# Patient Record
Sex: Female | Born: 1990 | Race: Black or African American | Hispanic: No | Marital: Single | Smoking: Current every day smoker
Health system: Southern US, Community
[De-identification: ages and names within clinical notes are randomized; demographics above are authoritative.]

## PROBLEM LIST (undated history)

## (undated) DIAGNOSIS — Z789 Other specified health status: Secondary | ICD-10-CM

## (undated) HISTORY — PX: DILATION AND CURETTAGE OF UTERUS: SHX78

---
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
## 2010-01-06 ENCOUNTER — Ambulatory Visit: Payer: Self-pay | Admitting: Obstetrics and Gynecology

## 2010-01-19 ENCOUNTER — Ambulatory Visit (HOSPITAL_COMMUNITY): Admission: RE | Admit: 2010-01-19 | Discharge: 2010-01-19 | Payer: Self-pay | Admitting: Obstetrics and Gynecology

## 2010-01-19 ENCOUNTER — Ambulatory Visit: Payer: Self-pay | Admitting: Obstetrics and Gynecology

## 2010-02-03 ENCOUNTER — Inpatient Hospital Stay (HOSPITAL_COMMUNITY): Admission: AD | Admit: 2010-02-03 | Discharge: 2010-02-03 | Payer: Self-pay | Admitting: Obstetrics & Gynecology

## 2010-03-11 ENCOUNTER — Ambulatory Visit: Payer: Self-pay | Admitting: Obstetrics and Gynecology

## 2010-08-20 LAB — URINALYSIS, ROUTINE W REFLEX MICROSCOPIC
Bilirubin Urine: NEGATIVE
Glucose, UA: NEGATIVE mg/dL
Ketones, ur: NEGATIVE mg/dL
Leukocytes, UA: NEGATIVE
Nitrite: NEGATIVE
Protein, ur: 30 mg/dL — AB
Specific Gravity, Urine: 1.025 (ref 1.005–1.030)
Urobilinogen, UA: 1 mg/dL (ref 0.0–1.0)
pH: 7.5 (ref 5.0–8.0)

## 2010-08-20 LAB — CBC
HCT: 36.1 % (ref 36.0–46.0)
HCT: 36.8 % (ref 36.0–46.0)
Hemoglobin: 12.2 g/dL (ref 12.0–15.0)
Hemoglobin: 12.5 g/dL (ref 12.0–15.0)
MCH: 30.1 pg (ref 26.0–34.0)
MCH: 30.2 pg (ref 26.0–34.0)
MCHC: 33.8 g/dL (ref 30.0–36.0)
MCHC: 34 g/dL (ref 30.0–36.0)
MCV: 88.9 fL (ref 78.0–100.0)
MCV: 89 fL (ref 78.0–100.0)
Platelets: 219 10*3/uL (ref 150–400)
Platelets: 253 10*3/uL (ref 150–400)
RBC: 4.06 MIL/uL (ref 3.87–5.11)
RBC: 4.14 MIL/uL (ref 3.87–5.11)
RDW: 12.7 % (ref 11.5–15.5)
RDW: 13.3 % (ref 11.5–15.5)
WBC: 4.6 10*3/uL (ref 4.0–10.5)
WBC: 6.4 10*3/uL (ref 4.0–10.5)

## 2010-08-20 LAB — PREGNANCY, URINE: Preg Test, Ur: NEGATIVE

## 2010-08-20 LAB — URINE MICROSCOPIC-ADD ON

## 2010-08-20 LAB — POCT PREGNANCY, URINE: Preg Test, Ur: NEGATIVE

## 2010-08-22 ENCOUNTER — Emergency Department (HOSPITAL_COMMUNITY): Payer: Self-pay

## 2010-08-22 ENCOUNTER — Emergency Department (HOSPITAL_COMMUNITY)
Admission: EM | Admit: 2010-08-22 | Discharge: 2010-08-22 | Disposition: A | Discharge status: Home / Self Care | Payer: Self-pay | Attending: Emergency Medicine | Admitting: Emergency Medicine

## 2010-08-22 DIAGNOSIS — H5789 Other specified disorders of eye and adnexa: Secondary | ICD-10-CM | POA: Insufficient documentation

## 2010-08-22 DIAGNOSIS — S0010XA Contusion of unspecified eyelid and periocular area, initial encounter: Secondary | ICD-10-CM | POA: Insufficient documentation

## 2010-08-22 LAB — CBC
HCT: 37.3 % (ref 36.0–46.0)
Hemoglobin: 13 g/dL (ref 12.0–15.0)
MCH: 30.6 pg (ref 26.0–34.0)
MCHC: 34.8 g/dL (ref 30.0–36.0)
MCV: 88 fL (ref 78.0–100.0)
Platelets: 248 10*3/uL (ref 150–400)
RBC: 4.24 MIL/uL (ref 3.87–5.11)
RDW: 12.7 % (ref 11.5–15.5)
WBC: 6.1 10*3/uL (ref 4.0–10.5)

## 2010-08-22 LAB — VON WILLEBRAND PANEL
Factor-VIII Activity: 56 % (ref 50–150)
Ristocetin-Cofactor: 61 % (ref 50–150)
Von Willebrand Ag: 95 % normal (ref 61–164)

## 2010-08-22 LAB — POCT PREGNANCY, URINE: Preg Test, Ur: NEGATIVE

## 2010-08-24 LAB — POCT I-STAT, CHEM 8
BUN: 5 mg/dL — ABNORMAL LOW (ref 6–23)
Calcium, Ion: 1.21 mmol/L (ref 1.12–1.32)
Chloride: 107 mEq/L (ref 96–112)
Creatinine, Ser: 0.7 mg/dL (ref 0.4–1.2)
Glucose, Bld: 84 mg/dL (ref 70–99)
HCT: 45 % (ref 36.0–46.0)
Hemoglobin: 15.3 g/dL — ABNORMAL HIGH (ref 12.0–15.0)
Potassium: 4.2 mEq/L (ref 3.5–5.1)
Sodium: 142 mEq/L (ref 135–145)
TCO2: 26 mmol/L (ref 0–100)

## 2010-08-24 LAB — WET PREP, GENITAL
Trich, Wet Prep: NONE SEEN
WBC, Wet Prep HPF POC: NONE SEEN
Yeast Wet Prep HPF POC: NONE SEEN

## 2010-08-24 LAB — GC/CHLAMYDIA PROBE AMP, GENITAL
Chlamydia, DNA Probe: NEGATIVE
GC Probe Amp, Genital: NEGATIVE

## 2010-08-24 LAB — POCT PREGNANCY, URINE: Preg Test, Ur: NEGATIVE

## 2010-08-25 LAB — POCT PREGNANCY, URINE: Preg Test, Ur: NEGATIVE

## 2010-08-25 LAB — WET PREP, GENITAL: Yeast Wet Prep HPF POC: NONE SEEN

## 2010-08-25 LAB — URINALYSIS, ROUTINE W REFLEX MICROSCOPIC
Bilirubin Urine: NEGATIVE
Glucose, UA: NEGATIVE mg/dL
Hgb urine dipstick: NEGATIVE
Ketones, ur: NEGATIVE mg/dL
Nitrite: NEGATIVE
Protein, ur: NEGATIVE mg/dL
Specific Gravity, Urine: 1.02 (ref 1.005–1.030)
Urobilinogen, UA: 1 mg/dL (ref 0.0–1.0)
pH: 7.5 (ref 5.0–8.0)

## 2010-08-25 LAB — GC/CHLAMYDIA PROBE AMP, GENITAL
Chlamydia, DNA Probe: NEGATIVE
GC Probe Amp, Genital: NEGATIVE

## 2010-08-25 LAB — RPR: RPR Ser Ql: NONREACTIVE

## 2010-09-11 LAB — URINE CULTURE

## 2010-09-11 LAB — DIFFERENTIAL
Eosinophils Absolute: 0.2 10*3/uL (ref 0.0–1.2)
Lymphocytes Relative: 54 % — ABNORMAL HIGH (ref 24–48)
Lymphs Abs: 3.4 10*3/uL (ref 1.1–4.8)
Monocytes Relative: 5 % (ref 3–11)
Neutro Abs: 2.4 10*3/uL (ref 1.7–8.0)
Neutrophils Relative %: 37 % — ABNORMAL LOW (ref 43–71)

## 2010-09-11 LAB — BASIC METABOLIC PANEL
Chloride: 108 mEq/L (ref 96–112)
Creatinine, Ser: 0.79 mg/dL (ref 0.4–1.2)

## 2010-09-11 LAB — URINALYSIS, ROUTINE W REFLEX MICROSCOPIC
Glucose, UA: NEGATIVE mg/dL
Leukocytes, UA: NEGATIVE
Nitrite: NEGATIVE
Protein, ur: NEGATIVE mg/dL
pH: 5.5 (ref 5.0–8.0)

## 2010-09-11 LAB — CBC
MCV: 87.7 fL (ref 78.0–98.0)
Platelets: 300 10*3/uL (ref 150–400)
RBC: 4.01 MIL/uL (ref 3.80–5.70)
WBC: 6.3 10*3/uL (ref 4.5–13.5)

## 2010-09-11 LAB — PROTIME-INR
INR: 1 (ref 0.00–1.49)
Prothrombin Time: 12.8 seconds (ref 11.6–15.2)

## 2010-09-11 LAB — URINE MICROSCOPIC-ADD ON

## 2010-09-11 LAB — APTT: aPTT: 27 seconds (ref 24–37)

## 2010-09-11 LAB — POCT PREGNANCY, URINE: Preg Test, Ur: NEGATIVE

## 2010-09-12 LAB — POCT PREGNANCY, URINE: Preg Test, Ur: NEGATIVE

## 2010-09-13 LAB — WET PREP, GENITAL
Clue Cells Wet Prep HPF POC: NONE SEEN
Yeast Wet Prep HPF POC: NONE SEEN

## 2010-09-13 LAB — GC/CHLAMYDIA PROBE AMP, GENITAL
Chlamydia, DNA Probe: NEGATIVE
GC Probe Amp, Genital: NEGATIVE

## 2010-09-13 LAB — URINALYSIS, ROUTINE W REFLEX MICROSCOPIC
Bilirubin Urine: NEGATIVE
Ketones, ur: 15 mg/dL — AB
Nitrite: NEGATIVE
Protein, ur: NEGATIVE mg/dL
Urobilinogen, UA: 1 mg/dL (ref 0.0–1.0)

## 2010-09-13 LAB — PREGNANCY, URINE: Preg Test, Ur: NEGATIVE

## 2010-09-29 ENCOUNTER — Ambulatory Visit: Payer: Self-pay | Admitting: Obstetrics and Gynecology

## 2010-10-17 ENCOUNTER — Emergency Department (HOSPITAL_COMMUNITY)
Admission: EM | Admit: 2010-10-17 | Discharge: 2010-10-17 | Disposition: A | Discharge status: Home / Self Care | Payer: Self-pay | Attending: Emergency Medicine | Admitting: Emergency Medicine

## 2010-10-17 ENCOUNTER — Encounter (HOSPITAL_COMMUNITY): Payer: Self-pay | Admitting: Radiology

## 2010-10-17 ENCOUNTER — Emergency Department (HOSPITAL_COMMUNITY): Payer: Self-pay

## 2010-10-17 DIAGNOSIS — R0602 Shortness of breath: Secondary | ICD-10-CM | POA: Insufficient documentation

## 2010-10-17 DIAGNOSIS — R071 Chest pain on breathing: Secondary | ICD-10-CM | POA: Insufficient documentation

## 2010-10-17 DIAGNOSIS — R079 Chest pain, unspecified: Secondary | ICD-10-CM | POA: Insufficient documentation

## 2010-10-17 DIAGNOSIS — R109 Unspecified abdominal pain: Secondary | ICD-10-CM | POA: Insufficient documentation

## 2010-10-17 DIAGNOSIS — M549 Dorsalgia, unspecified: Secondary | ICD-10-CM | POA: Insufficient documentation

## 2010-10-17 LAB — LIPASE, BLOOD: Lipase: 25 U/L (ref 11–59)

## 2010-10-17 LAB — COMPREHENSIVE METABOLIC PANEL
BUN: 9 mg/dL (ref 6–23)
CO2: 24 mEq/L (ref 19–32)
Calcium: 9.3 mg/dL (ref 8.4–10.5)
Chloride: 103 mEq/L (ref 96–112)
Creatinine, Ser: 0.63 mg/dL (ref 0.4–1.2)
GFR calc Af Amer: >60 mL/min (ref >60)
GFR calc non Af Amer: >60 mL/min (ref >60)
Total Bilirubin: 0.3 mg/dL (ref 0.3–1.2)

## 2010-10-17 LAB — CBC
HCT: 36.2 % (ref 36.0–46.0)
MCHC: 33.1 g/dL (ref 30.0–36.0)
MCV: 86 fL (ref 78.0–100.0)
Platelets: 294 10*3/uL (ref 150–400)
RDW: 12 % (ref 11.5–15.5)
WBC: 7.7 10*3/uL (ref 4.0–10.5)

## 2010-10-17 LAB — DIFFERENTIAL
Basophils Absolute: 0 10*3/uL (ref 0.0–0.1)
Eosinophils Absolute: 0.2 10*3/uL (ref 0.0–0.7)
Eosinophils Relative: 2 % (ref 0–5)
Lymphocytes Relative: 37 % (ref 12–46)
Lymphs Abs: 2.9 10*3/uL (ref 0.7–4.0)
Monocytes Absolute: 0.4 10*3/uL (ref 0.1–1.0)

## 2010-10-17 LAB — URINE MICROSCOPIC-ADD ON

## 2010-10-17 LAB — URINALYSIS, ROUTINE W REFLEX MICROSCOPIC
Leukocytes, UA: NEGATIVE
Nitrite: NEGATIVE
Protein, ur: NEGATIVE mg/dL
Specific Gravity, Urine: 1.024 (ref 1.005–1.030)
Urobilinogen, UA: 1 mg/dL (ref 0.0–1.0)

## 2010-10-17 MED ORDER — IOHEXOL 300 MG/ML  SOLN
80.0000 mL | Freq: Once | INTRAMUSCULAR | Status: AC | PRN
  Administered 2010-10-17: 80 mL via INTRAVENOUS

## 2011-11-27 ENCOUNTER — Encounter (HOSPITAL_COMMUNITY): Payer: Self-pay | Admitting: Emergency Medicine

## 2011-11-27 ENCOUNTER — Emergency Department (HOSPITAL_COMMUNITY)
Admission: EM | Admit: 2011-11-27 | Discharge: 2011-11-28 | Disposition: A | Discharge status: Home / Self Care | Payer: Self-pay | Attending: Emergency Medicine | Admitting: Emergency Medicine

## 2011-11-27 DIAGNOSIS — A499 Bacterial infection, unspecified: Secondary | ICD-10-CM | POA: Insufficient documentation

## 2011-11-27 DIAGNOSIS — N946 Dysmenorrhea, unspecified: Secondary | ICD-10-CM | POA: Insufficient documentation

## 2011-11-27 DIAGNOSIS — N949 Unspecified condition associated with female genital organs and menstrual cycle: Secondary | ICD-10-CM | POA: Insufficient documentation

## 2011-11-27 DIAGNOSIS — N760 Acute vaginitis: Secondary | ICD-10-CM | POA: Insufficient documentation

## 2011-11-27 DIAGNOSIS — B9689 Other specified bacterial agents as the cause of diseases classified elsewhere: Secondary | ICD-10-CM | POA: Insufficient documentation

## 2011-11-27 DIAGNOSIS — N938 Other specified abnormal uterine and vaginal bleeding: Secondary | ICD-10-CM | POA: Insufficient documentation

## 2011-11-27 DIAGNOSIS — F17200 Nicotine dependence, unspecified, uncomplicated: Secondary | ICD-10-CM | POA: Insufficient documentation

## 2011-11-27 LAB — BASIC METABOLIC PANEL
GFR calc Af Amer: >90 mL/min (ref >90)
GFR calc non Af Amer: >90 mL/min (ref >90)
Potassium: 3.7 mEq/L (ref 3.5–5.1)
Sodium: 141 mEq/L (ref 135–145)

## 2011-11-27 LAB — POCT PREGNANCY, URINE: Preg Test, Ur: NEGATIVE

## 2011-11-27 LAB — URINALYSIS, ROUTINE W REFLEX MICROSCOPIC
Nitrite: NEGATIVE
Specific Gravity, Urine: 1.021 (ref 1.005–1.030)
Urobilinogen, UA: 0.2 mg/dL (ref 0.0–1.0)

## 2011-11-27 LAB — CBC
Hemoglobin: 13 g/dL (ref 12.0–15.0)
RBC: 4.42 MIL/uL (ref 3.87–5.11)

## 2011-11-27 LAB — URINE MICROSCOPIC-ADD ON

## 2011-11-27 MED ORDER — HYDROMORPHONE HCL PF 1 MG/ML IJ SOLN
1.0000 mg | Freq: Once | INTRAMUSCULAR | Status: AC
  Administered 2011-11-27: 1 mg via INTRAVENOUS
  Filled 2011-11-27: qty 1

## 2011-11-27 MED ORDER — ONDANSETRON HCL 4 MG/2ML IJ SOLN
4.0000 mg | Freq: Once | INTRAMUSCULAR | Status: AC
  Administered 2011-11-27: 4 mg via INTRAVENOUS
  Filled 2011-11-27: qty 2

## 2011-11-27 NOTE — ED Notes (Addendum)
C/o abd pain, bilateral leg pain, pain with urination and vaginal bleeding since Friday.  Reports rectal bleeding this morning.  Reports vomiting and headache yesterday.

## 2011-11-27 NOTE — ED Notes (Signed)
abd pain since yesterday with nv none today

## 2011-11-28 LAB — WET PREP, GENITAL: Yeast Wet Prep HPF POC: NONE SEEN

## 2011-11-28 LAB — URINALYSIS, ROUTINE W REFLEX MICROSCOPIC
Ketones, ur: NEGATIVE mg/dL
Leukocytes, UA: NEGATIVE
Nitrite: NEGATIVE
Specific Gravity, Urine: 1.024 (ref 1.005–1.030)
Urobilinogen, UA: 0.2 mg/dL (ref 0.0–1.0)
pH: 5.5 (ref 5.0–8.0)

## 2011-11-28 MED ORDER — HYDROCODONE-ACETAMINOPHEN 5-325 MG PO TABS: 1.0000 | ORAL_TABLET | ORAL | Status: AC | PRN

## 2011-11-28 MED ORDER — METRONIDAZOLE 500 MG PO TABS: 500.0000 mg | ORAL_TABLET | Freq: Two times a day (BID) | ORAL | Status: AC

## 2011-11-28 NOTE — Discharge Instructions (Signed)
Abnormal Uterine Bleeding Abnormal uterine bleeding can have many causes. Some cases are simply treated, while others are more serious. There are several kinds of bleeding that is considered abnormal, including:  Bleeding between periods.   Bleeding after sexual intercourse.   Spotting anytime in the menstrual cycle.   Bleeding heavier or more than normal.   Bleeding after menopause.  CAUSES  There are many causes of abnormal uterine bleeding. It can be present in teenagers, pregnant women, women during their reproductive years, and women who have reached menopause. Your caregiver will look for the more common causes depending on your age, signs, symptoms and your particular circumstance. Most cases are not serious and can be treated. Even the more serious causes, like cancer of the female organs, can be treated adequately if found in the early stages. That is why all types of bleeding should be evaluated and treated as soon as possible. DIAGNOSIS  Diagnosing the cause may take several kinds of tests. Your caregiver may:  Take a complete history of the type of bleeding.   Perform a complete physical exam and Pap smear.   Take an ultrasound on the abdomen showing a picture of the female organs and the pelvis.   Inject dye into the uterus and Fallopian tubes and X-ray them (hysterosalpingogram).   Place fluid in the uterus and do an ultrasound (sonohysterogrqphy).   Take a CT scan to examine the female organs and pelvis.   Take an MRI to examine the female organs and pelvis. There is no X-ray involved with this procedure.   Look inside the uterus with a telescope that has a light at the end (hysteroscopy).   Scrap the inside of the uterus to get tissue to examine (Dilatation and Curettage, D&C).   Look into the pelvis with a telescope that has a light at the end (laparoscopy). This is done through a very small cut (incision) in the abdomen.  TREATMENT  Treatment will depend on the  cause of the abnormal bleeding. It can include:  Doing nothing to allow the problem to take care of itself over time.   Hormone treatment.   Birth control pills.   Treating the medical condition causing the problem.   Laparoscopy.   Major or minor surgery   Destroying the lining of the uterus with electrical currant, laser, freezing or heat (uterine ablation).  HOME CARE INSTRUCTIONS   Follow your caregiver's recommendation on how to treat your problem.   See your caregiver if you missed a menstrual period and think you may be pregnant.   If you are bleeding heavily, count the number of pads/tampons you use and how often you have to change them. Tell this to your caregiver.   Avoid sexual intercourse until the problem is controlled.  SEEK MEDICAL CARE IF:   You have any kind of abnormal bleeding mentioned above.   You feel dizzy at times.   You are 21 years old and have not had a menstrual period yet.  SEEK IMMEDIATE MEDICAL CARE IF:   You pass out.   You are changing pads/tampons every 15 to 30 minutes.   You have belly (abdominal) pain.   You have a temperature of 100 F (37.8 C) or higher.   You become sweaty or weak.   You are passing large blood clots from the vagina.   You start to feel sick to your stomach (nauseous) and throw up (vomit).  Document Released: 05/23/2005 Document Revised: 05/12/2011 Document Reviewed: 10/16/2008 [employer]   Patient Information 2012 [place], [place].Bacterial Vaginosis Bacterial vaginosis (BV) is a vaginal infection where the normal balance of bacteria in the vagina is disrupted. The normal balance is then replaced by an overgrowth of certain bacteria. There are several different kinds of bacteria that can cause BV. BV is the most common vaginal infection in women of childbearing age. CAUSES   The cause of BV is not fully understood. BV develops when there is an increase or imbalance of harmful bacteria.   Some activities or  behaviors can upset the normal balance of bacteria in the vagina and put women at increased risk including:   Having a new sex partner or multiple sex partners.   Douching.   Using an intrauterine device (IUD) for contraception.   It is not clear what role sexual activity plays in the development of BV. However, women that have never had sexual intercourse are rarely infected with BV.  Women do not get BV from toilet seats, bedding, swimming pools or from touching objects around them.  SYMPTOMS   Grey vaginal discharge.   A fish-like odor with discharge, especially after sexual intercourse.   Itching or burning of the vagina and vulva.   Burning or pain with urination.   Some women have no signs or symptoms at all.  DIAGNOSIS  Your caregiver must examine the vagina for signs of BV. Your caregiver will perform lab tests and look at the sample of vaginal fluid through a microscope. They will look for bacteria and abnormal cells (clue cells), a pH test higher than 4.5, and a positive amine test all associated with BV.  RISKS AND COMPLICATIONS   Pelvic inflammatory disease (PID).   Infections following gynecology surgery.   Developing HIV.   Developing herpes virus.  TREATMENT  Sometimes BV will clear up without treatment. However, all women with symptoms of BV should be treated to avoid complications, especially if gynecology surgery is planned. Female partners generally do not need to be treated. However, BV may spread between female sex partners so treatment is helpful in preventing a recurrence of BV.   BV may be treated with antibiotics. The antibiotics come in either pill or vaginal cream forms. Either can be used with nonpregnant or pregnant women, but the recommended dosages differ. These antibiotics are not harmful to the baby.   BV can recur after treatment. If this happens, a second round of antibiotics will often be prescribed.   Treatment is important for pregnant women.  If not treated, BV can cause a premature delivery, especially for a pregnant woman who had a premature birth in the past. All pregnant women who have symptoms of BV should be checked and treated.   For chronic reoccurrence of BV, treatment with a type of prescribed gel vaginally twice a week is helpful.  HOME CARE INSTRUCTIONS   Finish all medication as directed by your caregiver.   Do not have sex until treatment is completed.   Tell your sexual partner that you have a vaginal infection. They should see their caregiver and be treated if they have problems, such as a mild rash or itching.   Practice safe sex. Use condoms. Only have 1 sex partner.  PREVENTION  Basic prevention steps can help reduce the risk of upsetting the natural balance of bacteria in the vagina and developing BV:  Do not have sexual intercourse (be abstinent).   Do not douche.   Use all of the medicine prescribed for treatment of BV, even if the  signs and symptoms go away.   Tell your sex partner if you have BV. That way, they can be treated, if needed, to prevent reoccurrence.  SEEK MEDICAL CARE IF:   Your symptoms are not improving after 3 days of treatment.   You have increased discharge, pain, or fever.  MAKE SURE YOU:   Understand these instructions.   Will watch your condition.   Will get help right away if you are not doing well or get worse.  FOR MORE INFORMATION  Division of STD Prevention (DSTDP), Centers for Disease Control and Prevention: [employer] American Social Health Association (ASHA): www.ashastd.org  Document Released: 05/23/2005 Document Revised: 05/12/2011 Document Reviewed: 11/13/2008 [facility] Patient Information 2012 [place], [place].Dysmenorrhea Menstrual pain is caused by the muscles of the uterus tightening (contracting) during a menstrual period. The muscles of the uterus contract due to the chemicals in the uterine lining. Primary dysmenorrhea is menstrual cramps that last a  couple of days when you start having menstrual periods or soon after. This often begins after a teenager starts having her period. As a woman gets older or has a baby, the cramps will usually lesson or disappear. Secondary dysmenorrhea begins later in life, lasts longer, and the pain may be stronger than primary dysmenorrhea. The pain may start before the period and last a few days after the period. This type of dysmenorrhea is usually caused by an underlying problem such as:  The tissue lining the uterus grows outside of the uterus in other areas of the body (endometriosis).   The endometrial tissue, which normally lines the uterus, is found in or grows into the muscular walls of the uterus (adenomyosis).   The pelvic blood vessels are engorged with blood just before the menstrual period (pelvic congestive syndrome).   Overgrowth of cells in the lining of the uterus or cervix (polyps of the uterus or cervix).   Falling down of the uterus (prolapse) because of loose or stretched ligaments.   Depression.   Bladder problems, infection, or inflammation.   Problems with the intestine, a tumor, or irritable bowel syndrome.   Cancer of the female organs or bladder.   A severely tipped uterus.   A very tight opening or closed cervix.   Noncancerous tumors of the uterus (fibroids).   Pelvic inflammatory disease (PID).   Pelvic scarring (adhesions) from a previous surgery.   Ovarian cyst.   An intrauterine device (IUD) used for birth control.  CAUSES  The cause of menstrual pain is often unknown. SYMPTOMS   Cramping or throbbing pain in your lower abdomen.   Sometimes, a woman may also experience headaches.   Lower back pain.   Feeling sick to your stomach (nausea) or vomiting.   Diarrhea.   Sweating or dizziness.  DIAGNOSIS  A diagnosis is based on your history, symptoms, physical examination, diagnostic tests, or procedures. Diagnostic tests or procedures may  include:  Blood tests.   An ultrasound.   An examination of the lining of the uterus (dilation and curettage, D&C).   An examination inside your abdomen or pelvis with a scope (laparoscopy).   X-rays.   CT Scan.   MRI.   An examination inside the bladder with a scope (cystoscopy).   An examination inside the intestine or stomach with a scope (colonoscopy, gastroscopy).  TREATMENT  Treatment depends on the cause of the dysmenorrhea. Treatment may include:  Pain medicine prescribed by your caregiver.   Birth control pills.   Hormone replacement therapy.   Nonsteroidal  anti-inflammatory drugs (NSAIDs). These may help stop the production of prostaglandins.   An IUD with progesterone hormone in it.   Acupuncture.   Surgery to remove adhesions, endometriosis, ovarian cyst, or fibroids.   Removal of the uterus (hysterectomy).   Progesterone shots to stop the menstrual period.   Cutting the nerves on the sacrum that go to the female organs (presacral neurectomy).   Electric currant to the sacral nerves (sacral nerve stimulation).   Antidepressant medicine.   Psychiatric therapy, counseling, or group therapy.   Exercise and physical therapy.   Meditation and yoga therapy.  HOME CARE INSTRUCTIONS   Only take over-the-counter or prescription medicines for pain, discomfort, or fever as directed by your caregiver.   Place a heating pad or hot water bottle on your lower back or abdomen. Do not sleep with the heating pad.   Use aerobic exercises, walking, swimming, biking, and other exercises to help lessen the cramping.   Massage to the lower back or abdomen may help.   Stop smoking.   Avoid alcohol and caffeine.   Yoga, meditation, or acupuncture may help.  SEEK MEDICAL CARE IF:   The pain does not get better with medicine.   You have pain with sexual intercourse.  SEEK IMMEDIATE MEDICAL CARE IF:   Your pain increases and is not controlled with medicines.    You have a fever.   You develop nausea or vomiting with your period not controlled with medicine.   You have abnormal vaginal bleeding with your period.   You pass out.  MAKE SURE YOU:   Understand these instructions.   Will watch your condition.   Will get help right away if you are not doing well or get worse.  Document Released: 05/23/2005 Document Revised: 05/12/2011 Document Reviewed: 09/08/2008 [facility] Patient Information 2012 [place], [place].

## 2011-11-28 NOTE — ED Provider Notes (Signed)
History     CSN: [id number]  Arrival date & time 11/27/11  1702   First MD Initiated Contact with Patient 11/27/11 2204      Chief Complaint  Patient presents with  . Abdominal Pain    (Consider location/radiation/quality/duration/timing/severity/associated sxs/prior treatment) HPI Comments: Patient with PMH of D&C s/p DUB in the past presents tonight with crampy lower abdominal pain with vaginal bleeding - she states that she initially started her menstrual cycle early in June, she states that it came on again on Friday, this time much heavier.  She reports crampy abdominal pain with heavy bleeding and dysuria, states that she also noted blood in her stool as well.  States mild vomiting and headache as well this morning - states this feels like when she had to have a D&C at [facility].  Denies fever, chills, nausea, pregnancy, syncope.  Patient is a 21 y.o. female presenting with abdominal pain. The history is provided by the patient. No language interpreter was used.  Abdominal Pain The primary symptoms of the illness include abdominal pain, vomiting, dysuria and vaginal bleeding. The primary symptoms of the illness do not include fever, fatigue, shortness of breath, nausea, diarrhea, hematemesis, hematochezia or vaginal discharge. The current episode started more than 2 days ago. The onset of the illness was gradual. The problem has been gradually worsening.  The dysuria is associated with hematuria. The dysuria is not associated with frequency or urgency.   The patient states that she believes she is currently not pregnant. The patient has not had a change in bowel habit. Additional symptoms associated with the illness include hematuria. Symptoms associated with the illness do not include chills, anorexia, diaphoresis, heartburn, constipation, urgency, frequency or back pain.    History reviewed. No pertinent past medical history.  History reviewed. No pertinent past surgical  history.  No family history on file.  History  Substance Use Topics  . Smoking status: Current Everyday Smoker  . Smokeless tobacco: Not on file  . Alcohol Use: No    OB History    Grav Para Term Preterm Abortions TAB SAB Ect Mult Living                  Review of Systems  Constitutional: Negative for fever, chills, diaphoresis and fatigue.  HENT: Negative for neck pain.   Eyes: Negative for pain.  Respiratory: Negative for chest tightness and shortness of breath.   Cardiovascular: Negative for chest pain.  Gastrointestinal: Positive for vomiting and abdominal pain. Negative for heartburn, nausea, diarrhea, constipation, hematochezia, anorexia and hematemesis.  Genitourinary: Positive for dysuria, hematuria and vaginal bleeding. Negative for urgency, frequency and vaginal discharge.  Musculoskeletal: Negative for back pain.  Skin: Negative for color change and pallor.  Neurological: Positive for headaches.  All other systems reviewed and are negative.    Allergies  Review of patient's allergies indicates no known allergies.  Home Medications  No current outpatient prescriptions on file.  BP 111/77  Pulse 60  Temp 98.1 F (36.7 C) (Oral)  Resp 18  SpO2 98%  LMP 11/07/2011  Physical Exam  Nursing note and vitals reviewed. Constitutional: She is oriented to person, place, and time. She appears well-developed and well-nourished. No distress.  HENT:  Head: Normocephalic and atraumatic.  Right Ear: External ear normal.  Left Ear: External ear normal.  Nose: Nose normal.  Mouth/Throat: Oropharynx is clear and moist. No oropharyngeal exudate.  Eyes: Conjunctivae are normal. Pupils are equal, round, and reactive to light.  No scleral icterus.  Neck: Normal range of motion. Neck supple.  Cardiovascular: Normal rate, regular rhythm and normal heart sounds.  Exam reveals no gallop and no friction rub.   No murmur heard. Pulmonary/Chest: Effort normal and breath sounds  normal. No respiratory distress. She has no wheezes.  Abdominal: Soft. Bowel sounds are normal. She exhibits no distension and no mass. There is tenderness. There is no rebound and no guarding.    Genitourinary: There is no rash or tenderness on the right labia. There is no rash or tenderness on the left labia. Uterus is tender. Cervix exhibits no motion tenderness, no discharge and no friability. Right adnexum displays no mass and no tenderness. Left adnexum displays no mass and no tenderness. There is bleeding around the vagina. No vaginal discharge found.  Musculoskeletal: Normal range of motion. She exhibits no edema and no tenderness.  Lymphadenopathy:    She has no cervical adenopathy.  Neurological: She is alert and oriented to person, place, and time. No cranial nerve deficit. She exhibits normal muscle tone. Coordination normal.  Skin: Skin is warm and dry. No rash noted. No erythema. No pallor.  Psychiatric: She has a normal mood and affect. Her behavior is normal. Judgment and thought content normal.    ED Course  Procedures (including critical care time)  Labs Reviewed  URINALYSIS, ROUTINE W REFLEX MICROSCOPIC - Abnormal; Notable for the following:    Color, Urine RED (*)  BIOCHEMICALS MAY BE AFFECTED BY COLOR   APPearance TURBID (*)     Hgb urine dipstick LARGE (*)     Ketones, ur 15 (*)     Protein, ur >300 (*)     Leukocytes, UA SMALL (*)     All other components within normal limits  WET PREP, GENITAL - Abnormal; Notable for the following:    Clue Cells Wet Prep HPF POC FEW (*)     WBC, Wet Prep HPF POC FEW (*)     All other components within normal limits  BASIC METABOLIC PANEL  CBC  POCT PREGNANCY, URINE  URINE MICROSCOPIC-ADD ON  URINALYSIS, ROUTINE W REFLEX MICROSCOPIC  GC/CHLAMYDIA PROBE AMP, GENITAL   No results found.  Results for orders placed during the hospital encounter of 11/27/11  URINALYSIS, ROUTINE W REFLEX MICROSCOPIC      Component Value Range     Color, Urine RED (*) YELLOW   APPearance TURBID (*) CLEAR   Specific Gravity, Urine 1.021  1.005 - 1.030   pH 6.0  5.0 - 8.0   Glucose, UA NEGATIVE  NEGATIVE mg/dL   Hgb urine dipstick LARGE (*) NEGATIVE   Bilirubin Urine NEGATIVE  NEGATIVE   Ketones, ur 15 (*) NEGATIVE mg/dL   Protein, ur >098 (*) NEGATIVE mg/dL   Urobilinogen, UA 0.2  0.0 - 1.0 mg/dL   Nitrite NEGATIVE  NEGATIVE   Leukocytes, UA SMALL (*) NEGATIVE  BASIC METABOLIC PANEL      Component Value Range   Sodium 141  135 - 145 mEq/L   Potassium 3.7  3.5 - 5.1 mEq/L   Chloride 108  96 - 112 mEq/L   CO2 25  19 - 32 mEq/L   Glucose, Bld 77  70 - 99 mg/dL   BUN 8  6 - 23 mg/dL   Creatinine, Ser 1.19  0.50 - 1.10 mg/dL   Calcium 9.3  8.4 - 14.7 mg/dL   GFR calc non Af Amer >90  >90 mL/min   GFR calc Af Amer >90  >  90 mL/min  CBC      Component Value Range   WBC 7.2  4.0 - 10.5 K/uL   RBC 4.42  3.87 - 5.11 MIL/uL   Hemoglobin 13.0  12.0 - 15.0 g/dL   HCT 96.0  45.4 - 09.8 %   MCV 87.8  78.0 - 100.0 fL   MCH 29.4  26.0 - 34.0 pg   MCHC 33.5  30.0 - 36.0 g/dL   RDW 11.9  14.7 - 82.9 %   Platelets 262  150 - 400 K/uL  POCT PREGNANCY, URINE      Component Value Range   Preg Test, Ur NEGATIVE  NEGATIVE  URINE MICROSCOPIC-ADD ON      Component Value Range   Squamous Epithelial / LPF RARE  RARE   WBC, UA 7-10  <3 WBC/hpf   RBC / HPF TOO NUMEROUS TO COUNT  <3 RBC/hpf   Bacteria, UA RARE  RARE  WET PREP, GENITAL      Component Value Range   Yeast Wet Prep HPF POC NONE SEEN  NONE SEEN   Trich, Wet Prep NONE SEEN  NONE SEEN   Clue Cells Wet Prep HPF POC FEW (*) NONE SEEN   WBC, Wet Prep HPF POC FEW (*) NONE SEEN  URINALYSIS, ROUTINE W REFLEX MICROSCOPIC      Component Value Range   Color, Urine YELLOW  YELLOW   APPearance CLEAR  CLEAR   Specific Gravity, Urine 1.024  1.005 - 1.030   pH 5.5  5.0 - 8.0   Glucose, UA NEGATIVE  NEGATIVE mg/dL   Hgb urine dipstick NEGATIVE  NEGATIVE   Bilirubin Urine NEGATIVE   NEGATIVE   Ketones, ur NEGATIVE  NEGATIVE mg/dL   Protein, ur NEGATIVE  NEGATIVE mg/dL   Urobilinogen, UA 0.2  0.0 - 1.0 mg/dL   Nitrite NEGATIVE  NEGATIVE   Leukocytes, UA NEGATIVE  NEGATIVE   No results found.   Dysmenorrhea DUB BV   MDM  Patient here with crampy mild abdominal pain with a history of DUB, she presents tonight with the same thing, there is no evidence of ovarian symptoms based on pelvic examination.  I do not suspect abdominal etiology to this, patient will be discharged with pain medication, flagyl and to follow up with her GYN at [facility] hospital        Dexter C. Shellman, [place] 11/28/11 [phone]

## 2011-11-29 NOTE — ED Provider Notes (Signed)
Medical screening examination/treatment/procedure(s) were performed by non-physician practitioner and as supervising physician I was immediately available for consultation/collaboration.   Joya Gaskins, MD 11/29/11 [phone]

## 2012-09-02 ENCOUNTER — Emergency Department (HOSPITAL_COMMUNITY)
Admission: EM | Admit: 2012-09-02 | Discharge: 2012-09-02 | Disposition: A | Discharge status: Home / Self Care | Payer: Self-pay | Attending: Emergency Medicine | Admitting: Emergency Medicine

## 2012-09-02 ENCOUNTER — Encounter (HOSPITAL_COMMUNITY): Payer: Self-pay | Admitting: *Deleted

## 2012-09-02 DIAGNOSIS — H9209 Otalgia, unspecified ear: Secondary | ICD-10-CM | POA: Insufficient documentation

## 2012-09-02 DIAGNOSIS — R1310 Dysphagia, unspecified: Secondary | ICD-10-CM | POA: Insufficient documentation

## 2012-09-02 DIAGNOSIS — J029 Acute pharyngitis, unspecified: Secondary | ICD-10-CM | POA: Insufficient documentation

## 2012-09-02 DIAGNOSIS — F17200 Nicotine dependence, unspecified, uncomplicated: Secondary | ICD-10-CM | POA: Insufficient documentation

## 2012-09-02 MED ORDER — OXYCODONE-ACETAMINOPHEN 5-325 MG PO TABS
1.0000 | ORAL_TABLET | Freq: Once | ORAL | Status: AC
  Administered 2012-09-02: 1 via ORAL
  Filled 2012-09-02: qty 1

## 2012-09-02 MED ORDER — CLINDAMYCIN HCL 300 MG PO CAPS: 300.0000 mg | ORAL_CAPSULE | Freq: Four times a day (QID) | ORAL | Status: DC

## 2012-09-02 MED ORDER — IBUPROFEN 600 MG PO TABS: 600.0000 mg | ORAL_TABLET | Freq: Once | ORAL | Status: DC

## 2012-09-02 MED ORDER — IBUPROFEN 200 MG PO TABS
600.0000 mg | ORAL_TABLET | Freq: Once | ORAL | Status: AC
  Administered 2012-09-02: 600 mg via ORAL
  Filled 2012-09-02: qty 1

## 2012-09-02 NOTE — ED Notes (Signed)
I looked at her throat and I noticed some white patches and discharge with a reddened area.

## 2012-09-02 NOTE — ED Notes (Signed)
Pt reports having sore throat with white exudate since earlier today. Also having chills and left ear pain. Airway intact.

## 2012-09-02 NOTE — ED Notes (Signed)
Painful to eat and drink.

## 2012-09-02 NOTE — ED Notes (Addendum)
Pt complains of sore throat with pain in L ear and L side of face. Pt denies N/V/D and no cough. Pt reports "thick white stuff in the back of my throat."

## 2012-09-02 NOTE — ED Provider Notes (Signed)
History     CSN: [id number]  Arrival date & time 09/02/12  1746   First MD Initiated Contact with Patient 09/02/12 1840      Chief Complaint  Patient presents with  . Sore Throat    (Consider location/radiation/quality/duration/timing/severity/associated sxs/prior treatment) HPI Patient reports worsening sore throat over the past several days.  Painful to swallow.  Some left ear pain.  No dental pain.  No difficulty breathing.  Pain with swallowing but able to keep oral fluids down.  No nausea vomiting or diarrhea.  No chest pain or abdominal pain.  No other complaints.  No fevers or chills at home.  Her symptoms are mild to moderate in severity.  Nothing improves her pain.  Her pain is worsened by swallowing.   History reviewed. No pertinent past medical history.  History reviewed. No pertinent past surgical history.  History reviewed. No pertinent family history.  History  Substance Use Topics  . Smoking status: Current Every Day Smoker  . Smokeless tobacco: Not on file  . Alcohol Use: No    OB History   Grav Para Term Preterm Abortions TAB SAB Ect Mult Living                  Review of Systems  All other systems reviewed and are negative.    Allergies  Review of patient's allergies indicates no known allergies.  Home Medications  No current outpatient prescriptions on file.  BP 123/69  Pulse 78  Temp(Src) 98.8 F (37.1 C) (Oral)  Resp 18  SpO2 100%  LMP 08/06/2012  Physical Exam  Nursing note and vitals reviewed. Constitutional: She is oriented to person, place, and time. She appears well-developed and well-nourished. No distress.  HENT:  Head: Normocephalic and atraumatic.  Uvula midline.  Posterior pharyngeal erythema and tonsillar erythema.  Tonsillar exudate.  No evidence of peritonsillar abscess.  Tolerating secretions.  Oral airway patent.  Dentition normal.  Eyes: EOM are normal.  Neck: Normal range of motion. Neck supple.  Cardiovascular:  Normal rate, regular rhythm and normal heart sounds.   Pulmonary/Chest: Effort normal and breath sounds normal.  Abdominal: Soft. She exhibits no distension. There is no tenderness.  Musculoskeletal: Normal range of motion.  Lymphadenopathy:    She has no cervical adenopathy.  Neurological: She is alert and oriented to person, place, and time.  Skin: Skin is warm and dry.  Psychiatric: She has a normal mood and affect. Judgment normal.    ED Course  Procedures (including critical care time)  Labs Reviewed  RAPID STREP SCREEN   No results found.   1. Pharyngitis       MDM  Appears to be strep pharyngitis.  Discharge home with antibiotics and pain medicine.  Instructions return to ER for new or worsening symptoms.  Oral hydration recommended.  Vital signs normal.        Lyanne Co, MD 09/02/12 1910

## 2012-09-03 ENCOUNTER — Telehealth (HOSPITAL_COMMUNITY): Payer: Self-pay | Admitting: Emergency Medicine

## 2013-03-12 ENCOUNTER — Emergency Department (HOSPITAL_COMMUNITY)
Admission: EM | Admit: 2013-03-12 | Discharge: 2013-03-12 | Disposition: A | Discharge status: Home / Self Care | Payer: No Typology Code available for payment source | Attending: Emergency Medicine | Admitting: Emergency Medicine

## 2013-03-12 DIAGNOSIS — Y939 Activity, unspecified: Secondary | ICD-10-CM | POA: Insufficient documentation

## 2013-03-12 DIAGNOSIS — T148XXA Other injury of unspecified body region, initial encounter: Secondary | ICD-10-CM | POA: Insufficient documentation

## 2013-03-12 DIAGNOSIS — S0990XA Unspecified injury of head, initial encounter: Secondary | ICD-10-CM | POA: Insufficient documentation

## 2013-03-12 DIAGNOSIS — Y92410 Unspecified street and highway as the place of occurrence of the external cause: Secondary | ICD-10-CM | POA: Insufficient documentation

## 2013-03-12 DIAGNOSIS — F17200 Nicotine dependence, unspecified, uncomplicated: Secondary | ICD-10-CM | POA: Insufficient documentation

## 2013-03-12 DIAGNOSIS — R519 Headache, unspecified: Secondary | ICD-10-CM

## 2013-03-12 MED ORDER — ACETAMINOPHEN 325 MG PO TABS
650.0000 mg | ORAL_TABLET | Freq: Once | ORAL | Status: AC
  Administered 2013-03-12: 650 mg via ORAL
  Filled 2013-03-12: qty 2

## 2013-03-12 MED ORDER — METHOCARBAMOL 500 MG PO TABS: 1000.0000 mg | ORAL_TABLET | Freq: Four times a day (QID) | ORAL | Status: DC

## 2013-03-12 MED ORDER — NAPROXEN 500 MG PO TABS: 500.0000 mg | ORAL_TABLET | Freq: Two times a day (BID) | ORAL | Status: DC
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
## 2013-03-12 NOTE — ED Provider Notes (Signed)
Medical screening examination/treatment/procedure(s) were performed by non-physician practitioner and as supervising physician I was immediately available for consultation/collaboration.  Eyleen Rawlinson T Alvah Lagrow, MD 03/12/13 2328 

## 2013-03-13 ENCOUNTER — Emergency Department (HOSPITAL_COMMUNITY): Payer: No Typology Code available for payment source

## 2013-03-13 ENCOUNTER — Emergency Department (HOSPITAL_COMMUNITY)
Admission: EM | Admit: 2013-03-13 | Discharge: 2013-03-13 | Disposition: A | Discharge status: Home / Self Care | Payer: No Typology Code available for payment source | Attending: Emergency Medicine | Admitting: Emergency Medicine

## 2013-03-13 ENCOUNTER — Encounter (HOSPITAL_COMMUNITY): Payer: Self-pay | Admitting: Emergency Medicine

## 2013-03-13 DIAGNOSIS — M542 Cervicalgia: Secondary | ICD-10-CM

## 2013-03-13 DIAGNOSIS — F17200 Nicotine dependence, unspecified, uncomplicated: Secondary | ICD-10-CM | POA: Insufficient documentation

## 2013-03-13 DIAGNOSIS — M549 Dorsalgia, unspecified: Secondary | ICD-10-CM

## 2013-03-13 DIAGNOSIS — M545 Low back pain, unspecified: Secondary | ICD-10-CM | POA: Insufficient documentation

## 2013-03-13 DIAGNOSIS — R51 Headache: Secondary | ICD-10-CM | POA: Insufficient documentation

## 2013-03-13 DIAGNOSIS — Z79899 Other long term (current) drug therapy: Secondary | ICD-10-CM | POA: Insufficient documentation

## 2013-03-13 DIAGNOSIS — G8911 Acute pain due to trauma: Secondary | ICD-10-CM | POA: Insufficient documentation

## 2013-03-13 MED ORDER — KETOROLAC TROMETHAMINE 60 MG/2ML IM SOLN
60.0000 mg | Freq: Once | INTRAMUSCULAR | Status: AC
Start: 2013-03-13 — End: 2013-03-13
  Administered 2013-03-13: 60 mg via INTRAMUSCULAR
  Filled 2013-03-13: qty 2

## 2013-03-13 MED ORDER — MELOXICAM 7.5 MG PO TABS: 15.0000 mg | ORAL_TABLET | Freq: Every day | ORAL | Status: DC
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
## 2013-03-14 NOTE — ED Provider Notes (Signed)
Medical screening examination/treatment/procedure(s) were performed by non-physician practitioner and as supervising physician I was immediately available for consultation/collaboration.   Junius Argyle, MD 03/14/13 [phone]

## 2013-07-11 ENCOUNTER — Emergency Department (HOSPITAL_COMMUNITY): Payer: Self-pay

## 2013-07-11 ENCOUNTER — Emergency Department (HOSPITAL_COMMUNITY)
Admission: EM | Admit: 2013-07-11 | Discharge: 2013-07-11 | Disposition: A | Discharge status: Home / Self Care | Payer: Self-pay | Attending: Emergency Medicine | Admitting: Emergency Medicine

## 2013-07-11 ENCOUNTER — Encounter (HOSPITAL_COMMUNITY): Payer: Self-pay | Admitting: Emergency Medicine

## 2013-07-11 DIAGNOSIS — H5711 Ocular pain, right eye: Secondary | ICD-10-CM

## 2013-07-11 DIAGNOSIS — F17200 Nicotine dependence, unspecified, uncomplicated: Secondary | ICD-10-CM | POA: Insufficient documentation

## 2013-07-11 DIAGNOSIS — G8929 Other chronic pain: Secondary | ICD-10-CM | POA: Insufficient documentation

## 2013-07-11 DIAGNOSIS — H05019 Cellulitis of unspecified orbit: Secondary | ICD-10-CM | POA: Insufficient documentation

## 2013-07-11 DIAGNOSIS — H109 Unspecified conjunctivitis: Secondary | ICD-10-CM | POA: Insufficient documentation

## 2013-07-11 DIAGNOSIS — Z3202 Encounter for pregnancy test, result negative: Secondary | ICD-10-CM | POA: Insufficient documentation

## 2013-07-11 DIAGNOSIS — L03213 Periorbital cellulitis: Secondary | ICD-10-CM

## 2013-07-11 LAB — PREGNANCY, URINE: PREG TEST UR: NEGATIVE

## 2013-07-11 LAB — COMPREHENSIVE METABOLIC PANEL
ALBUMIN: 3.9 g/dL (ref 3.5–5.2)
ALT: 6 U/L (ref 0–35)
AST: 10 U/L (ref 0–37)
Alkaline Phosphatase: 56 U/L (ref 39–117)
BUN: 9 mg/dL (ref 6–23)
CO2: 25 mEq/L (ref 19–32)
Calcium: 9.3 mg/dL (ref 8.4–10.5)
Chloride: 104 mEq/L (ref 96–112)
Creatinine, Ser: 0.83 mg/dL (ref 0.50–1.10)
GFR calc Af Amer: >90 mL/min (ref >90)
GFR calc non Af Amer: >90 mL/min (ref >90)
Glucose, Bld: 60 mg/dL — ABNORMAL LOW (ref 70–99)
Potassium: 4.3 mEq/L (ref 3.7–5.3)
SODIUM: 139 meq/L (ref 137–147)
TOTAL PROTEIN: 7.2 g/dL (ref 6.0–8.3)
Total Bilirubin: 0.4 mg/dL (ref 0.3–1.2)

## 2013-07-11 LAB — CBC WITH DIFFERENTIAL/PLATELET
Basophils Absolute: 0 10*3/uL (ref 0.0–0.1)
Basophils Relative: 0 % (ref 0–1)
EOS PCT: 5 % (ref 0–5)
Eosinophils Absolute: 0.4 10*3/uL (ref 0.0–0.7)
HEMATOCRIT: 37.5 % (ref 36.0–46.0)
HEMOGLOBIN: 12.4 g/dL (ref 12.0–15.0)
LYMPHS PCT: 41 % (ref 12–46)
Lymphs Abs: 2.8 10*3/uL (ref 0.7–4.0)
MCH: 29.5 pg (ref 26.0–34.0)
MCHC: 33.1 g/dL (ref 30.0–36.0)
MCV: 89.1 fL (ref 78.0–100.0)
MONO ABS: 0.5 10*3/uL (ref 0.1–1.0)
MONOS PCT: 8 % (ref 3–12)
NEUTROS ABS: 3.2 10*3/uL (ref 1.7–7.7)
Neutrophils Relative %: 47 % (ref 43–77)
Platelets: 228 10*3/uL (ref 150–400)
RBC: 4.21 MIL/uL (ref 3.87–5.11)
RDW: 12.4 % (ref 11.5–15.5)
WBC: 7 10*3/uL (ref 4.0–10.5)

## 2013-07-11 MED ORDER — TETRACAINE HCL 0.5 % OP SOLN
2.0000 [drp] | Freq: Once | OPHTHALMIC | Status: AC
  Administered 2013-07-11: 2 [drp] via OPHTHALMIC
  Filled 2013-07-11: qty 2

## 2013-07-11 MED ORDER — IOHEXOL 300 MG/ML  SOLN
100.0000 mL | Freq: Once | INTRAMUSCULAR | Status: AC | PRN
  Administered 2013-07-11: 100 mL via INTRAVENOUS

## 2013-07-11 MED ORDER — AMOXICILLIN-POT CLAVULANATE 875-125 MG PO TABS: 1.0000 | ORAL_TABLET | Freq: Two times a day (BID) | ORAL | Status: DC

## 2013-07-11 MED ORDER — FLUORESCEIN SODIUM 1 MG OP STRP
1.0000 | ORAL_STRIP | Freq: Once | OPHTHALMIC | Status: AC
  Administered 2013-07-11: 1 via OPHTHALMIC
  Filled 2013-07-11: qty 1

## 2013-07-11 NOTE — ED Notes (Signed)
Pt states pain started yesterday in right eye with right sided facial pain.  Eye is swollen and red.

## 2013-07-11 NOTE — ED Notes (Signed)
Pt changed to level 3 acuity per PA

## 2013-07-11 NOTE — ED Provider Notes (Signed)
CSN: [id number]     Arrival date & time 07/11/13  1533 History  This chart was scribed for non-physician practitioner working with Flint Melter, MD by Ashley Jacobs, ED scribe. This patient was seen in room WTR8/WTR8 and the patient's care was started at 4:30 PM.  First MD Initiated Contact with Patient 07/11/13 1604     Chief Complaint  Patient presents with  . Eye Pain   (Consider location/radiation/quality/duration/timing/severity/associated sxs/prior Treatment) Patient is a 23 y.o. female presenting with eye pain. The history is provided by the patient and medical records. No language interpreter was used.  Eye Pain Pertinent negatives include no chest pain, no headaches and no shortness of breath.   HPI Comments: Pamela Costa is a 23 y.o. female who presents to the Emergency Department complaining of constant moderate right eye pain with right sided facial pain with onset of two days ago.  She has associated right eye swelling and redness that presented yesterday. Pt is unsure of injury. The pain is described as a constant sensation that "something is poking me in the eye". The pain is worse with palpation and motion. She also describes the sensation as "gritty", burning and itching. Pt has a right eye discharge and blurred vision. She has photophobia. Denies fever. Sometimes she rubs her eye but she denies rubbing her eye today. Pt has tried eye drops which only provides mild relief. Denies using contacts or massacre.  Denies allergies to medications and any prior medical complications. She currently smokes everyday and smokes marijuana. History reviewed. No pertinent past medical history. Past Surgical History  Procedure Laterality Date  . Dilation and curettage of uterus     No family history on file. History  Substance Use Topics  . Smoking status: Current Every Day Smoker  . Smokeless tobacco: Not on file  . Alcohol Use: No   OB History   Grav Para Term Preterm  Abortions TAB SAB Ect Mult Living                 Review of Systems  Constitutional: Negative for fever and chills.  Eyes: Positive for photophobia, pain, discharge, redness, itching and visual disturbance.  Respiratory: Negative for cough and shortness of breath.   Cardiovascular: Negative for chest pain.  Gastrointestinal: Negative for nausea and vomiting.  Musculoskeletal: Negative for neck pain and neck stiffness.  Neurological: Negative for headaches.  All other systems reviewed and are negative.    Allergies  Review of patient's allergies indicates no known allergies.  Home Medications   Current Outpatient Rx  Name  Route  Sig  Dispense  Refill  . naphazoline-pheniramine (NAPHCON-A) 0.025-0.3 % ophthalmic solution   Right Eye   Place 1 drop into the right eye 3 (three) times daily as needed for irritation.         Marland Kitchen amoxicillin-clavulanate (AUGMENTIN) 875-125 MG per tablet   Oral   Take 1 tablet by mouth every 12 (twelve) hours.   14 tablet   0    BP 113/59  Pulse 78  Temp(Src) 98.7 F (37.1 C) (Oral)  Resp 20  SpO2 100%  LMP 06/10/2013 Physical Exam  Nursing note and vitals reviewed. Constitutional: She is oriented to person, place, and time. She appears well-developed and well-nourished. No distress.  HENT:  Head: Normocephalic and atraumatic.  Right Ear: External ear normal.  Left Ear: External ear normal.  Mouth/Throat: Oropharynx is clear and moist. No oropharyngeal exudate.  Negative trismus.   Discomfort upon palpation  to the right pre-auricular region - negative nodes palpated.   Eyes: EOM are normal. Pupils are equal, round, and reactive to light. Lids are everted and swept, no foreign bodies found. Right eye exhibits no discharge and no exudate. No foreign body present in the right eye. Left eye exhibits no discharge. Right conjunctiva is injected. Right conjunctiva has no hemorrhage. Left conjunctiva is not injected. Left conjunctiva has no  hemorrhage. Right eye exhibits normal extraocular motion and no nystagmus. Left eye exhibits normal extraocular motion and no nystagmus. Right pupil is round and reactive. Left pupil is round and reactive.  Fundoscopic exam:      The right eye shows no AV nicking, no exudate, no hemorrhage and no papilledema. The right eye shows red reflex.       The left eye shows no AV nicking, no exudate, no hemorrhage and no papilledema. The left eye shows red reflex.  Slit lamp exam:      The right eye shows no corneal abrasion, no corneal flare, no corneal ulcer, no foreign body, no hyphema, no hypopyon and no fluorescein uptake.  Swelling localized to the right orbit-upper and lower lips are noted to be swollen. Negative erythema, inflammation noted-negative warmth upon palpation. Injection of the sclera noted to the right eye. Discomfort upon palpation to the right eye. Active tearing - negative drainage or discharge noted to the right eye. Negative Seidel sign. Negative uptake. Negative dendritic lesion.  Neck: Normal range of motion. Neck supple. No tracheal deviation present.  Negative neck stiffness Negative nuchal rigidity Negative cervical lymphadenopathy  Cardiovascular: Normal rate, regular rhythm and normal heart sounds.  Exam reveals no friction rub.   No murmur heard. Pulses:      Radial pulses are 2+ on the right side, and 2+ on the left side.  Pulmonary/Chest: Effort normal and breath sounds normal. No respiratory distress. She has no wheezes. She has no rales.  Musculoskeletal: Normal range of motion.  Full ROM to upper and lower extremities without difficulty noted, negative ataxia noted.  Lymphadenopathy:    She has no cervical adenopathy.  Neurological: She is alert and oriented to person, place, and time. No cranial nerve deficit. She exhibits normal muscle tone. Coordination normal.  Cranial nerves III-XII grossly intact  Skin: Skin is warm and dry. She is not diaphoretic.   Psychiatric: She has a normal mood and affect. Her behavior is normal. Thought content normal.    ED Course  Procedures (including critical care time) DIAGNOSTIC STUDIES: Oxygen Saturation is 100% on room air, normal by my interpretation.    COORDINATION OF CARE:  7:54 PM Discussed course of care with pt . Pt understands and agrees.  5:22 PM This provider spoke with CT regarding imaging to rule out orbital cellulitis - recommended CT with contrast.   7:01 PM This provider spoke with Dr. Vonna Kotyk, ophthalmologist, discussing case, history, presentation, labs, imaging, physical exam. As per physician recommended Augmentin to be started. Recommended patient to be seen and assessed in his office tomorrow morning, recommended patient to come before 12:00 PM.  Results for orders placed during the hospital encounter of 07/11/13  CBC WITH DIFFERENTIAL      Result Value Range   WBC 7.0  4.0 - 10.5 K/uL   RBC 4.21  3.87 - 5.11 MIL/uL   Hemoglobin 12.4  12.0 - 15.0 g/dL   HCT 16.1  09.6 - 04.5 %   MCV 89.1  78.0 - 100.0 fL   MCH 29.5  26.0 - 34.0 pg   MCHC 33.1  30.0 - 36.0 g/dL   RDW 16.112.4  09.611.5 - 04.515.5 %   Platelets 228  150 - 400 K/uL   Neutrophils Relative % 47  43 - 77 %   Neutro Abs 3.2  1.7 - 7.7 K/uL   Lymphocytes Relative 41  12 - 46 %   Lymphs Abs 2.8  0.7 - 4.0 K/uL   Monocytes Relative 8  3 - 12 %   Monocytes Absolute 0.5  0.1 - 1.0 K/uL   Eosinophils Relative 5  0 - 5 %   Eosinophils Absolute 0.4  0.0 - 0.7 K/uL   Basophils Relative 0  0 - 1 %   Basophils Absolute 0.0  0.0 - 0.1 K/uL  COMPREHENSIVE METABOLIC PANEL      Result Value Range   Sodium 139  137 - 147 mEq/L   Potassium 4.3  3.7 - 5.3 mEq/L   Chloride 104  96 - 112 mEq/L   CO2 25  19 - 32 mEq/L   Glucose, Bld 60 (*) 70 - 99 mg/dL   BUN 9  6 - 23 mg/dL   Creatinine, Ser 4.090.83  0.50 - 1.10 mg/dL   Calcium 9.3  8.4 - 81.110.5 mg/dL   Total Protein 7.2  6.0 - 8.3 g/dL   Albumin 3.9  3.5 - 5.2 g/dL   AST 10  0 - 37 U/L    ALT 6  0 - 35 U/L   Alkaline Phosphatase 56  39 - 117 U/L   Total Bilirubin 0.4  0.3 - 1.2 mg/dL   GFR calc non Af Amer >90  >90 mL/min   GFR calc Af Amer >90  >90 mL/min  PREGNANCY, URINE      Result Value Range   Preg Test, Ur NEGATIVE  NEGATIVE   Ct Orbits W/cm  07/11/2013   CLINICAL DATA:  Right eye pain. Right facial pain. Erythema and swelling.  EXAM: CT ORBITS WITH CONTRAST  TECHNIQUE: Multidetector CT imaging of the orbits was performed following the bolus administration of intravenous contrast.  CONTRAST:  100mL OMNIPAQUE IOHEXOL 300 MG/ML  SOLN  COMPARISON:  DG CERVICAL SPINE COMPLETE dated 03/13/2013; CT ORBITS W/O CM dated 08/22/2010  FINDINGS: Globes appear symmetric. Equivocal right periorbital soft tissue swelling without postseptal/intraorbital extension. No intraorbital abscess or orbital cellulitis. Extraocular muscles appear normal and symmetric.  Chronic bilateral maxillary sinusitis is observed. Infundibula patent. There is mucosal swelling in the nasal cavity, right greater than left.  There is a large dental cavity of the right posterior maxillary molar along the distal surface. There is a smaller cavity of the left posterior maxillary molar along the buccal surface.  IMPRESSION: 1. No intraorbital abscess or intraorbital cellulitis. Equivocal right periorbital soft tissue swelling could conceivably reflect periorbital cellulitis. 2. Chronic bilateral maxillary sinusitis. 3. Dental cavities of the posterior maxillary molars, with the right-sided cavity larger than the left.   Electronically Signed   By: Herbie BaltimoreWalt  Liebkemann M.D.   On: 07/11/2013 18:31    Labs Review Labs Reviewed  COMPREHENSIVE METABOLIC PANEL - Abnormal; Notable for the following:    Glucose, Bld 60 (*)    All other components within normal limits  CBC WITH DIFFERENTIAL  PREGNANCY, URINE   Imaging Review Ct Orbits W/cm  07/11/2013   CLINICAL DATA:  Right eye pain. Right facial pain. Erythema and swelling.   EXAM: CT ORBITS WITH CONTRAST  TECHNIQUE: Multidetector CT imaging of the orbits was  performed following the bolus administration of intravenous contrast.  CONTRAST:  OMNIPAQUE IOHEXOL 300 MG/ML  SOLN  COMPARISON:  DG CERVICAL SPINE COMPLETE dated 03/13/2013; CT ORBITS W/O CM dated 08/22/2010  FINDINGS: Globes appear symmetric. Equivocal right periorbital soft tissue swelling without postseptal/intraorbital extension. No intraorbital abscess or orbital cellulitis. Extraocular muscles appear normal and symmetric.  Chronic bilateral maxillary sinusitis is observed. Infundibula patent. There is mucosal swelling in the nasal cavity, right greater than left.  There is a large dental cavity of the right posterior maxillary molar along the distal surface. There is a smaller cavity of the left posterior maxillary molar along the buccal surface.  IMPRESSION: 1. No intraorbital abscess or intraorbital cellulitis. Equivocal right periorbital soft tissue swelling could conceivably reflect periorbital cellulitis. 2. Chronic bilateral maxillary sinusitis. 3. Dental cavities of the posterior maxillary molars, with the right-sided cavity larger than the left.   Electronically Signed   By: Herbie Baltimore M.D.   On: 07/11/2013 18:31    EKG Interpretation   None       MDM   1. Periorbital cellulitis of right eye   2. Acute right eye pain   3. Conjunctivitis     Medications  fluorescein ophthalmic strip 1 strip (1 strip Right Eye Given 07/11/13 1630)  tetracaine (PONTOCAINE) 0.5 % ophthalmic solution 2 drop (2 drops Right Eye Given 07/11/13 1630)  iohexol (OMNIPAQUE) 300 MG/ML solution 100 mL (100 mLs Intravenous Contrast Given 07/11/13 1821)   Filed Vitals:   07/11/13 1539  BP: 113/59  Pulse: 78  Temp: 98.7 F (37.1 C)  TempSrc: Oral  Resp: 20  SpO2: 100%    I personally performed the services described in this documentation, which was scribed in my presence. The recorded information has been reviewed  and is accurate.  Patient presenting to emergency Department with right eye pain that started on Tuesday with swelling that started yesterday with redness. Stated that she has a constant pain described as a burning, gritty sensation-as if someone was poking her in her eye. Reported constant drainage. Stated that there is mild pruritis to the right eye but it is painful upon scratching. Reported that she's been using allergy drops-reported minimal relief. Stated that she's been using warm compressions. Alert and oriented. GCS 15. Heart rate and rhythm normal. Lungs clear to auscultation to upper lower lobes bilaterally. Radial pulses 2+ bilaterally. Swelling localized to the right orbit-upper and lower eyelids with out erythema, inflammation, warmth upon palpation. Discomfort upon palpation. Injection noted to the right eye. Active tearing. Injection noted to palpebral conjunctiva. Negative foreign body swiped to upper and lower eyelids. PERRLA, negative nystagmus. Funduscopic exam unremarkable. Negative findings of corneal abrasion, dendritic lesions, cell sign. Negative uptake. Discomfort upon palpation to the right pre-auricular region with negative nodes palpated. Negative signs of mastoiditis.  Visual acuity bilateral 20/25, right 20/30, left 20/25. CBC negative elevation white blood cell count noted-negative leukocytosis. CMP negative findings. Urine pregnancy negative. CT orbits with contrast identified no intraorbital abscess or intraorbital cellulitis-equivocal right periorbital soft tissue swelling could conceivably reflect periorbital cellulitis, chronic bilateral maxillary sinusitis. This provider spoke with ophthalmologist-Dr. Jearld Fenton recommended Augmentin to be prescribed. Recommended patient be followed up in his office tomorrow. Doubt preseptal and post-septal cellulitis. Doubt temporal arteritis. Doubt glaucoma. Doubt herpetic keratitis. Doubt corneal abrasion. Suspicion to be possible  beginnings of orbital cellulitis, possible conjunctivitis. Patient stable, afebrile. Patient is not septic at this moment. Discharged patient. Discharged patient with Augmentin. Discussed with patient to rest  and stay hydrated. Referred patient to primary care provider and ophthalmologist. Instructed patient to followup with ophthalmologist tomorrow-is expecting to see her. Discussed with patient to closely monitor symptoms and if symptoms are to worsen or change to report back to the ED - strict return instructions given.  Patient agreed to plan of care, understood, all questions answered.    Raymon Mutton, PA-C 07/12/13 1434

## 2013-07-11 NOTE — Discharge Instructions (Signed)
Please call your doctor for a followup appointment within 24-48 hours. When you talk to your doctor please let them know that you were seen in the emergency department and have them acquire all of your records so that they can discuss the findings with you and formulate a treatment plan to fully care for your new and ongoing problems. Please call and set up him with her primary care provider to be reassessed Please call and setup an appointment with Dr. Berta MinorBevis-ophthalmologist, I doctor. Discussed case with him regarding your symptoms and findings on imaging-physician is expecting to see you in his office tomorrow. His office opens at 7:00 AM, he would like to see you before 12:00 PM. Please take antibiotics as prescribed- take antibiotics please take on a full stomach Please rest and stay hydrated Please apply cool compressions Please continue to monitor symptoms closely if symptoms are to worsen or change (fever greater than 101, chills, neck pain, chest pain, shortness of breath, difficulty breathing, swelling to the eye that worsens, drainage, pus drainage, inability to open the eye, blurred vision, sudden loss of vision, worsening pain, numbness to the face, inability to open and close the jaw) please report back to the emergency department immediately  Periorbital Cellulitis Periorbital cellulitis is a common infection that can affect the eyelid and the soft tissues that surround the eyeball. The infection may also affect the structures that produce and drain tears. It does not affect the eyeball itself. Natural tissue barriers usually prevent the spread of this infection to the eyeball and other deeper areas of the eye socket.  CAUSES  Bacterial infection.  Long-term (chronic) sinus infections.  An object (foreign body) stuck behind the eye.  An injury that goes through the eyelid tissues.  An injury that causes an infection, such as an insect sting.  Fracture of the bone around the  eye.  Infections which have spread from the eyelid or other structures around the eye.  Bite wounds.  Inflammation or infection of the lining membranes of the brain (meningitis).  An infection in the blood (septicemia).  Dental infection (abscess).  Viral infection (this is rare). SYMPTOMS Symptoms usually come on suddenly.  Pain in the eye.  Red, hot, and swollen eyelids and possibly cheeks. The swelling is sometimes bad enough that the eyelids cannot open. Some infections make the eyelids look purple.  Fever and feeling generally ill.  Pain when touching the area around the eye. DIAGNOSIS  Periorbital cellulitis can be diagnosed from an eye exam. In severe cases, your caregiver might suggest:  Blood tests.  Imaging tests (such as a CT scan) to examine the sinuses and the area around and behind the eyeball. TREATMENT If your caregiver feels that you do not have any signs of serious infection, treatment may include:  Antibiotics.  Nasal decongestants to reduce swelling.  Referral to a dentist if it is suspected that the infection was caused by a prior tooth infection.  Examination every day to make sure the problem is improving. HOME CARE INSTRUCTIONS  Take your antibiotics as directed. Finish them even if you start to feel better.  Some pain is normal with this condition. Take pain medicine as directed by your caregiver. Only take pain medicines approved by your caregiver.  It is important to drink fluids. Drink enough water and fluids to keep your urine clear or pale yellow.  Do not smoke.  Rest and get plenty of sleep.  Mild or moderate fevers generally have no long-term effects  and often do not require treatment.  If your caregiver has given you a follow-up appointment, it is very important to keep that appointment. Your caregiver will need to make sure that the infection is getting better. It is important to check that a more serious infection is not  developing. SEEK IMMEDIATE MEDICAL CARE IF:  Your eyelids become more painful, red, warm, or swollen.  You develop double vision or your vision becomes blurred or worsens in any way.  You have trouble moving your eyes.  The eye looks like it is popping out (proptosis).  You develop a severe headache, severe neck pain, or neck stiffness.  You develop repeated vomiting.  You have a fever or persistent symptoms for more than 72 hours.  You have a fever and your symptoms suddenly get worse. MAKE SURE YOU:  Understand these instructions.  Will watch your condition.  Will get help right away if you are not doing well or get worse. Document Released: 06/25/2010 Document Revised: 08/15/2011 Document Reviewed: 06/25/2010 [facility] Patient Information 2014 [place], [place].   Bacterial Conjunctivitis Bacterial conjunctivitis, commonly called pink eye, is an inflammation of the clear membrane that covers the white part of the eye (conjunctiva). The inflammation can also happen on the underside of the eyelids. The blood vessels in the conjunctiva become inflamed causing the eye to become red or pink. Bacterial conjunctivitis may spread easily from one eye to another and from person to person (contagious).  CAUSES  Bacterial conjunctivitis is caused by bacteria. The bacteria may come from your own skin, your upper respiratory tract, or from someone else with bacterial conjunctivitis. SYMPTOMS  The normally white color of the eye or the underside of the eyelid is usually pink or red. The pink eye is usually associated with irritation, tearing, and some sensitivity to light. Bacterial conjunctivitis is often associated with a thick, yellowish discharge from the eye. The discharge may turn into a crust on the eyelids overnight, which causes your eyelids to stick together. If a discharge is present, there may also be some blurred vision in the affected eye. DIAGNOSIS  Bacterial conjunctivitis is  diagnosed by your caregiver through an eye exam and the symptoms that you report. Your caregiver looks for changes in the surface tissues of your eyes, which may point to the specific type of conjunctivitis. A sample of any discharge may be collected on a cotton-tip swab if you have a severe case of conjunctivitis, if your cornea is affected, or if you keep getting repeat infections that do not respond to treatment. The sample will be sent to a lab to see if the inflammation is caused by a bacterial infection and to see if the infection will respond to antibiotic medicines. TREATMENT   Bacterial conjunctivitis is treated with antibiotics. Antibiotic eyedrops are most often used. However, antibiotic ointments are also available. Antibiotics pills are sometimes used. Artificial tears or eye washes may ease discomfort. HOME CARE INSTRUCTIONS   To ease discomfort, apply a cool, clean wash cloth to your eye for 10 20 minutes, 3 4 times a day.  Gently wipe away any drainage from your eye with a warm, wet washcloth or a cotton ball.  Wash your hands often with soap and water. Use paper towels to dry your hands.  Do not share towels or wash cloths. This may spread the infection.  Change or wash your pillow case every day.  You should not use eye makeup until the infection is gone.  Do not operate  machinery or drive if your vision is blurred.  Stop using contacts lenses. Ask your caregiver how to sterilize or replace your contacts before using them again. This depends on the type of contact lenses that you use.  When applying medicine to the infected eye, do not touch the edge of your eyelid with the eyedrop bottle or ointment tube. SEEK IMMEDIATE MEDICAL CARE IF:   Your infection has not improved within 3 days after beginning treatment.  You had yellow discharge from your eye and it returns.  You have increased eye pain.  Your eye redness is spreading.  Your vision becomes blurred.  You  have a fever or persistent symptoms for more than 2 3 days.  You have a fever and your symptoms suddenly get worse.  You have facial pain, redness, or swelling. MAKE SURE YOU:   Understand these instructions.  Will watch your condition.  Will get help right away if you are not doing well or get worse. Document Released: 05/23/2005 Document Revised: 02/15/2012 Document Reviewed: 10/24/2011 [facility] Patient Information 2014 [place], [place].   Emergency Department Resource Guide 1) Find a Doctor and Pay Out of Pocket Although you won't have to find out who is covered by your insurance plan, it is a good idea to ask around and get recommendations. You will then need to call the office and see if the doctor you have chosen will accept you as a new patient and what types of options they offer for patients who are self-pay. Some doctors offer discounts or will set up payment plans for their patients who do not have insurance, but you will need to ask so you aren't surprised when you get to your appointment.  2) Contact Your Local Health Department Not all health departments have doctors that can see patients for sick visits, but many do, so it is worth a call to see if yours does. If you don't know where your local health department is, you can check in your phone book. The CDC also has a tool to help you locate your state's health department, and many state websites also have listings of all of their local health departments.  3) Find a Walk-in Clinic If your illness is not likely to be very severe or complicated, you may want to try a walk in clinic. These are popping up all over the country in pharmacies, drugstores, and shopping centers. They're usually staffed by nurse practitioners or physician assistants that have been trained to treat common illnesses and complaints. They're usually fairly quick and inexpensive. However, if you have serious medical issues or chronic medical problems, these  are probably not your best option.  No Primary Care Doctor: - Call Health Connect at  [phone] - they can help you locate a primary care doctor that  accepts your insurance, provides certain services, etc. - Physician Referral Service- [phone]  Chronic Pain Problems: Organization         Address  Phone   Notes  [facility]  [phone] Patients need to be referred by their primary care doctor.   Medication Assistance: Organization         Address  Phone   Notes  [facility] [address] [place], [place] [address] [phone] --Must be a resident of [facility] -- Must have NO insurance coverage whatsoever (no Medicaid/ Medicare, etc.) -- The pt. MUST have a primary care doctor that directs their care regularly and  follows them in the community   MedAssist  [phone]   [employer]  [phone]    Agencies that provide inexpensive medical care: Organization         Address  Phone   Notes  [facility]  [phone]   [facility]    [phone]   [facility] [address] [place] [address] [place], [place] [address] [phone]   [facility] [address] [place], [place] [phone]   Planned Parenthood    [phone]   [facility]    [phone]   [facility]  [address], [place] Phone:  [phone], Fax:  [phone] Hours of Operation:  9 am - 6 pm, M-F.  Also accepts Medicaid/Medicare and self-pay.  [facility]  [address], [place] Phone: [phone], Fax: [phone]. Hours of Operation:  8:30 am - 5:30 pm, M-F.  Also accepts Medicaid and self-pay.  [facility] [place] [address], [place] Phone: [phone]   [facility] [address] Natasha [place] [place], [place] [phone], Ext. [phone] Mondays &  Thursdays: 7-9 AM.  First 15 patients are seen on a first come, first serve basis.    Medicaid-accepting [facility] Providers:  Organization         Address  Phone   Notes  [facility] [address], [place] [phone] Also accepts self-pay patients.  [facility] [address] Laurell Josephs [place], [place]  [phone]   [facility] [address], [place] [phone]   [facility] [address], [place] [phone]   Renaye Rakers [address], [place]   [phone] Only accepts [place] patients after they have their name applied to their card.   Self-Pay (no insurance) in [facility]:  Organization         Address  Phone   Notes  Sickle Cell Patients, [facility] [address] [place], [place] [phone]   [facility] [address] [place], [place] [phone]   [facility] [place]  [address], [place] [phone]   [facility]/Dr. Osei-Bonsu  [address], [place] or [address], [place] [phone] Phone number for both [place] and [place] locations is the same.  [facility] [address] [place] [phone]   [facility] [address], [place] or [address] [phone]   [facility] [address], [place] [phone], phone; [phone], fax Sees patients 1st and 3rd Saturday of every month.  Must not qualify for public or private insurance (i.e. Medicaid, Medicare, [place] Health Choice, Veterans' Benefits)  Household income should be no more than 200% of the poverty level The clinic cannot treat you if you are pregnant or think you are pregnant  Sexually transmitted diseases are not treated at the  clinic.    Dental Care: Organization         Address  Phone  Notes  [facility] [address] [place], [place] [phone] Accepts children up to age 68 who are  enrolled in Medicaid or [place] Health Choice; pregnant women with a Medicaid card; and children who have applied for Medicaid or [place] Health Choice, but were declined, whose parents can pay a reduced fee at time of service.  [facility] of [employer]  [address], [place] [phone] Accepts children up to age 68 who are enrolled in [place] or [place] Health Choice; pregnant women with a Medicaid card; and children who have applied for Medicaid or  Health Choice, but were declined, whose parents can pay a reduced fee at time of service.  [facility]  [address] [place], [place] [phone] Patients are seen by appointment only. Walk-ins are not accepted. [facility] will see patients 81 years of age and older. Monday - Tuesday (8am-5pm) Most Wednesdays (8:30-5pm) $30 per visit, cash only  [facility]  [address], [facility] [phone] Patients are seen by appointment only. Walk-ins are not accepted. [facility] will see patients 41 years of age and older. One Wednesday Evening (Monthly: Volunteer Based).  $30 per visit, cash only  [employer]  [phone] for adults; Children under age 39, call Graduate Pediatric Dentistry at [phone]. Children aged 78-14, please call [phone] to request a pediatric application.  Dental services are provided in all areas of dental care including fillings, crowns and bridges, complete and partial dentures, implants, gum treatment, root canals, and extractions. Preventive care is also provided. Treatment is provided to both adults and children. Patients are selected via a lottery and there is often a  waiting list.   [facility] [address], [place]  [phone] [email or web address]   [facility] [address] [place], [place] [phone], Ext. [phone] Second and Fourth Thursday of each month, opens at 6:30 AM; Clinic ends at 9 AM.  Patients are seen on a first-come first-served basis, and a limited number are seen during each clinic.   [facility]  [address] Ether Griffins [place], [place] [phone]   Eligibility Requirements You must have lived in [place], [place], or [place] counties for at least the last three months.   You cannot be eligible for state or federal sponsored [place], including [employer], [place], or [employer].   You generally cannot be eligible for healthcare insurance through your employer.    How to apply: Eligibility screenings are held every Tuesday and Wednesday afternoon from 1:00 pm until 4:00 pm. You do not need an appointment for the interview!  [facility] [address] [place], [place] [phone]   [facility] Health Department  [phone]   [facility] Health Department  [phone]   [facility]  [phone]    Behavioral Health Resources in the Community: Intensive Outpatient Programs Organization         Address  Phone  Notes  [facility] [address] [place], [place] [phone]   [facility] Outpatient [address], [place], [place] [phone]   [facility] [address], [place], [place]  [phone]   [facility] [address],  [place], [place] [phone] or [phone]   Substance Abuse Resources Organization         Address  Phone  Notes  [facility]  [phone]   [facility]  [phone]  [facility]  [phone]   Floydene Flock  [phone]   Residential & Outpatient Substance Abuse  Program  9045449165   Psychological Services Organization         Address  Phone  Notes  [facility] Behavioral Health  [phone]   [facility] Services  [phone]   [facility] [address] [place] [address], [place] [phone] or [phone]    Mobile Crisis Teams Organization         Address  Phone  Notes  [facility], Mobile Crisis Care Unit  [phone]   [facility]  [address] [place], [place] [phone]   Doristine Locks [address] [place] [address] [place] [phone]    Self-Help/Support Groups Organization         Address  Phone             Notes  [facility] [place] - variety of support groups  336- I7437963 Call for more information  Narcotics Anonymous (NA), [facility] [address], [employer] [place]  2 meetings at this location   Statistician         Address  Phone  Notes  [facility] [address],    [place]  [phone]   [facility]  [address] [place] [address], [place], [place] [phone]   [facility] [address] [place], [place] [phone] Admissions: 8am-3pm M-F  [facility] [address],    [place], [place] [phone]   [facility] [address] [place], [place], [place] [phone]   [facility] [address],  [place], [place] [phone]   [facility] - Intensive Outpatient [address] Laure[address] Josephs [address], [place], [place] [phone]   [facility] (Addiction Recovery [facility] [address]  [place], [place] [phone] or [phone]   [facility] ([facility]) [address] [place], [place] [phone] Accepts Medicaid  [facility] [place] [address]  [place] [phone] Substance Abuse/Addiction Treatment   [facility] Organization          Address  Phone  Notes  [facility]  [phone]   Angie Fava, PhD [address] Ervin Knack [place], [place]   [phone] or [phone]   [facility] Behavioral   [address] [place], [place] [phone]   [facility] [address] [place], [place] [phone] Insurance/Medicaid/sponsorship through [facility] and Families [address]                                    [place], [place] [phone] Therapy/tele-psych/case  [facility] [address] [place], [place] [phone]    Dr. Lolly Mustache  [phone]   [facility]  [employer] [facility] Dept. 1) [address] [place] 2) [address] [place] 3)  [address], [place] [phone]  [phone]   [facility] Child Abuse Hotline 231-268-1491 or [phone] (After Hours)

## 2013-07-13 NOTE — ED Provider Notes (Signed)
Medical screening examination/treatment/procedure(s) were performed by non-physician practitioner and as supervising physician I was immediately available for consultation/collaboration.  Flint MelterElliott L Rosamary Boudreau, MD 07/13/13 [phone]

## 2013-07-15 ENCOUNTER — Encounter (HOSPITAL_COMMUNITY): Payer: Self-pay | Admitting: Emergency Medicine

## 2013-07-15 ENCOUNTER — Emergency Department (HOSPITAL_COMMUNITY)
Admission: EM | Admit: 2013-07-15 | Discharge: 2013-07-15 | Disposition: A | Discharge status: Home / Self Care | Payer: No Typology Code available for payment source | Attending: Emergency Medicine | Admitting: Emergency Medicine

## 2013-07-15 DIAGNOSIS — L03213 Periorbital cellulitis: Secondary | ICD-10-CM

## 2013-07-15 DIAGNOSIS — Z792 Long term (current) use of antibiotics: Secondary | ICD-10-CM | POA: Insufficient documentation

## 2013-07-15 DIAGNOSIS — F17200 Nicotine dependence, unspecified, uncomplicated: Secondary | ICD-10-CM | POA: Insufficient documentation

## 2013-07-15 DIAGNOSIS — H00039 Abscess of eyelid unspecified eye, unspecified eyelid: Secondary | ICD-10-CM | POA: Insufficient documentation

## 2013-07-15 DIAGNOSIS — J329 Chronic sinusitis, unspecified: Secondary | ICD-10-CM

## 2013-07-15 MED ORDER — KETOROLAC TROMETHAMINE 0.5 % OP SOLN: 1.0000 [drp] | Freq: Four times a day (QID) | OPHTHALMIC | Status: DC

## 2013-07-15 MED ORDER — KETOROLAC TROMETHAMINE 0.5 % OP SOLN
1.0000 [drp] | Freq: Once | OPHTHALMIC | Status: AC
Start: 2013-07-15 — End: 2013-07-15
  Administered 2013-07-15: 1 [drp] via OPHTHALMIC
  Filled 2013-07-15: qty 3

## 2013-07-15 MED ORDER — FLUTICASONE PROPIONATE 50 MCG/ACT NA SUSP: 2.0000 | Freq: Every day | NASAL | Status: DC

## 2013-07-15 NOTE — Discharge Instructions (Signed)
Call for a follow up appointment with a Family or Primary Care Provider.  Call an opthalmology for further evaluation of your eye. Continue taking Augmentin.  Return if Symptoms worsen.   Take medication as prescribed.    Emergency Department Resource Guide 1) Find a Doctor and Pay Out of Pocket Although you won't have to find out who is covered by your insurance plan, it is a good idea to ask around and get recommendations. You will then need to call the office and see if the doctor you have chosen will accept you as a new patient and what types of options they offer for patients who are self-pay. Some doctors offer discounts or will set up payment plans for their patients who do not have insurance, but you will need to ask so you aren't surprised when you get to your appointment.  2) Contact Your Local Health Department Not all health departments have doctors that can see patients for sick visits, but many do, so it is worth a call to see if yours does. If you don't know where your local health department is, you can check in your phone book. The CDC also has a tool to help you locate your state's health department, and many state websites also have listings of all of their local health departments.  3) Find a Walk-in Clinic If your illness is not likely to be very severe or complicated, you may want to try a walk in clinic. These are popping up all over the country in pharmacies, drugstores, and shopping centers. They're usually staffed by nurse practitioners or physician assistants that have been trained to treat common illnesses and complaints. They're usually fairly quick and inexpensive. However, if you have serious medical issues or chronic medical problems, these are probably not your best option.  No Primary Care Doctor: - Call Health Connect at  [phone] - they can help you locate a primary care doctor that  accepts your insurance, provides certain services, etc. - Physician Referral  Service- [phone]  Chronic Pain Problems: Organization         Address  Phone   Notes  [facility]  [phone] Patients need to be referred by their primary care doctor.   Medication Assistance: Organization         Address  Phone   Notes  [facility] [address] [place], [place] [address] [phone]) [phone] --Must be a resident of [facility] -- Must have NO insurance coverage whatsoever (no Medicaid/ Medicare, etc.) -- The pt. MUST have a primary care doctor that directs their care regularly and follows them in the community   MedAssist  [phone]   [employer]  [phone]    Agencies that provide inexpensive medical care: Organization         Address  Phone   Notes  [facility]  [phone]   [facility]    [phone]   [facility] [address] [place], [place] [address] [phone]   [facility] [address], [place] [phone]) [phone]   Planned Parenthood    [phone]   [facility]    [phone]   [facility]  [address], [place] Phone:  [phone]) [phone], Fax:  [phone]) [phone] Hours of Operation:  9 am - 6 pm, M-F.  Also accepts Medicaid/Medicare and self-pay.  [facility] for [address], [address], [place] Phone: [phone], Fax: [phone]. Hours of Operation:  8:30 am - 5:30 pm, M-F.  Also accepts Medicaid and self-pay.  [facility] [place] [address] [place] Phone: [phone]   [place], [place], [place] [phone], Ext. [phone] Mondays & Thursdays: 7-9 AM.  First 15 patients are seen on a first come, first serve basis.    [place] Providers:  Organization         Address  Phone   Notes  [facility] [address],  [phone] Also accepts self-pay patients.  [facility] [address], [place]  [phone]   [address] [place], [address], [place] [phone]   [facility] [address] [place] [phone]   Lucianne Lei [address], [place]   [phone] Only accepts [place] Access [place] patients after they have their name applied to their card.   Self-Pay (no insurance) in [facility]:  Organization         Address  Phone   Notes  Sickle Cell Patients, [facility] [place] [phone]   [facility] [place] [phone]   [facility] [place]  [place], [place], [place] [phone]   [facility]/Dr. Osei-Bonsu  [address], [place] or [address], [place] [phone] Phone number for both [place] and [place] locations is the same.  [facility]gent [facility] and [facility] [address] [place] [phone]   [facility] [address], [place] or [address] [phone]   [facility] [address], [place] [phone], phone; [phone], fax Sees patients 1st and 3rd Saturday of every month.  Must not qualify for public or private insurance (i.e. Medicaid, Medicare, [place] Health Choice, Veterans' Benefits)  Household income should be no more than 200% of the poverty level The clinic cannot treat you if you are pregnant or think you are pregnant  Sexually transmitted diseases are not treated at the clinic.    Dental Care: Organization         Address  Phone  Notes  [facility] [place] [phone] Accepts children up to age 97 who are enrolled in  [place] or [place]; pregnant women with a Medicaid card; and children who have applied for Medicaid or [place] Health Choice, but were declined, whose parents can pay a reduced fee at time of service.  [facility]  [address], [place] [phone] Accepts children up to age 19 who are enrolled in [place] or [place]; pregnant women with a Medicaid card; and children who have applied for Medicaid or [place] Health Choice, but were declined, whose parents can pay a reduced fee at time of service.  [facility] PRO[facility]  [place] [phone] Patients are seen by appointment only. Walk-ins are not accepted. [place] will see patients 69 years of age and older. Monday - Tuesday (8am-5pm) Most Wednesdays (8:30-5pm) $30 per  visit, cash only  [facility] Adult [employer] PROGRAM  [address], [address] [phone] Patients are seen by appointment only. Walk-ins are not accepted. [place] will see patients 48 years of age and older. One Wednesday Evening (Monthly: Volunteer Based).  $30 per visit, cash only  [place]  [phone] for adults; Children under age 62, call Graduate Pediatric Dentistry at [phone]. Children aged 41-14, please call [phone] to request a pediatric application.  Dental services are provided in all areas of dental care including fillings, crowns and bridges, complete and partial dentures, implants, gum treatment, root canals, and extractions. Preventive care is also provided. Treatment is provided to both adults and children. Patients are selected via a lottery and there is often a waiting list.   [facility] [address] [place]  [phone] [email or web address]   [facility] [address] [place], [place] [phone], Ext. [phone] Second and Fourth Thursday of each month, opens at 6:30  AM; Clinic ends at 9 AM.  Patients are seen on a first-come first-served basis, and a limited number are seen during each clinic.   [facility]  [address] [place] Danker [place], [place] [phone]   Eligibility Requirements You must have lived in [place], [place], or [place] counties for at least the last three months.   You cannot be eligible for state or federal sponsored [employer], including [employer], [place], or [employer].   You generally cannot be eligible for healthcare insurance through your employer.    How to apply: Eligibility screenings are held every Tuesday and Wednesday afternoon from 1:00 pm until 4:00 pm. You do not need an appointment for the interview!  [facility] [address], [place], [place]   [place]  [place] Department  [place]  [phone]    Behavioral Health Resources in the Community: Intensive Outpatient Programs Organization         Address  Phone  Notes  [facility]ru Dyer. [address] [place], [place] [phone]   [facility] Outpatient [address] [place], [place]   ADS: Alcohol & Drug S[facility]cs [address] [place], [place]   [place] [address]  [place], [place] or [phone]   Substance Abuse Resources Organization         Address  Phone  Notes  Alcohol and Drug Services  [phone]   [place]  [phone]   The [place]   Chinita Pester  [phone]   Residential & Outpatient Substance Abuse Program  [phone]   Psychological Services Organization         Address  Phone  Notes  [facility] [place]  [place]  [phone]   [place] [address] [place] or  [phone]    Mobile Crisis Teams Organization         Address  Phone  Notes  [facility], Mobile Crisis Care Unit  [phone]   [facility]  [address] [place], [place]   [place] Levels [address], [place] [phone]    Self-Help/Support Groups Organization         Address  Phone  Notes  [facility] [place] - variety of support groups  [place] Call for more information  Narcotics Anonymous (NA), [facility] [address], [employer] [place]  2 meetings at this location   Special educational needs teacher         Address  Phone  Notes  AS[facility] Residential Treatment [place],    [place]  [phone]   [facility]  [address] [place] [phone], [place], [place]   [place], [place] [phone] Admissions: 8am-3pm M-F  Incentives Substance [place] [address]    [place], [place] [phone]   [facility] [address] [place], [place], [place]   [facility] [address],  [place], [place]   [facility] - Intensive Outpatient [address] Kristeen [address], [place], [place]   [facility] ([place] [address].,  [place], [place] [phone] or [phone]   [facility] Services (RTS) [address] [place], [place] Accepts Medicaid  [facility] [place] [address],  [place] [phone] Substance Abuse/Addiction Treatment   [facility] Organization         Address  Phone  Notes  [facility]  [phone]   Domenic Schwab, PhD [address] Arlis Porta [place], [place]   [phone] or [phone]   [place] and [place], [place] [phone]   [facility] [address],  [place], [place] [phone] Insurance/Medicaid/sponsorship through [facility] and Families [address], [address] [place]                                    [place], [place] [phone] [place] [address] [place], [place] [phone]    Dr. Adele Schilder  [phone]   [facility] Dept. 1) [address], [place] 2) [place] 3)  [place] [address], [place] [phone]  [phone]   [place] [phone] or [phone] (After Hours)

## 2013-07-15 NOTE — Progress Notes (Signed)
[facility] CL provided pt with a list of primary care resources, ACA information, and a GCC[facility] Orange Card application to help patient establish primary care.  °

## 2013-07-15 NOTE — ED Notes (Signed)
Pt seen here on 2.5.15 for pink eye. Pts reports that eye is worse. Pts eye is red swollen and unable to open right eye.

## 2013-07-15 NOTE — ED Provider Notes (Signed)
  Face-to-face evaluation   History: She c/o red and irritated right eye. No vision loss, "just a little blurry". Using Augmentin w/o relief.  Physical exam: OD- minimal dark discoloration right upper and lower lid w/o swelling or redness. Bright red conjunctivitis, with minimal clear d/c. EOMI. PERRLA. No Photophobia.  Medical screening examination/treatment/procedure(s) were conducted as a shared visit with non-physician practitioner(s) and myself.  I personally evaluated the patient during the encounter   Flint MelterElliott L Milos Milligan, MD 07/18/13 1622

## 2013-07-15 NOTE — ED Provider Notes (Signed)
CSN: [id number]     Arrival date & time 07/15/13  1248 History  This chart was scribed for non-physician practitioner Mellody DrownLauren Deaken Jurgens, PA-C working with Flint MelterElliott L Wentz, MD by Dorothey Basemania Sutton, ED Scribe. This patient was seen in room WTR6/WTR6 and the patient's care was started at 3:15 PM.    Chief Complaint  Patient presents with  . Conjunctivitis   The history is provided by the patient. No language interpreter was used.   HPI Comments: Claretta FraiseMeshea T Daubenspeck is a 23 y.o. female who presents to the Emergency Department complaining of a constant pain, described as a foreign body sensation, with associated redness, clear drainage with matting, and swelling to the right eye onset several days ago. She states that the pain is exacerbated with looking to the left, but denies any other exacerbating or alleviating factors. Patient was seen here on 07/11/2013 for similar complaints and diagnosed with bacterial conjunctivitis and started on a course of Augmentin. She states that she is still currently taking the Augmentin, but without any improvement of her symptoms. She denies fever, emesis. Patient has no other pertinent medical history.   History reviewed. No pertinent past medical history. Past Surgical History  Procedure Laterality Date  . Dilation and curettage of uterus     No family history on file. History  Substance Use Topics  . Smoking status: Current Every Day Smoker  . Smokeless tobacco: Not on file  . Alcohol Use: No   OB History   Grav Para Term Preterm Abortions TAB SAB Ect Mult Living                 Review of Systems  Constitutional: Negative for fever and chills.  Eyes: Positive for pain, discharge and redness.  Gastrointestinal: Negative for nausea and vomiting.  Musculoskeletal: Negative for neck pain.   Allergies  Review of patient's allergies indicates no known allergies.  Home Medications   Current Outpatient Rx  Name  Route  Sig  Dispense  Refill  .  amoxicillin-clavulanate (AUGMENTIN) 875-125 MG per tablet   Oral   Take 1 tablet by mouth every 12 (twelve) hours.   14 tablet   0   . ibuprofen (ADVIL,MOTRIN) 200 MG tablet   Oral   Take 400 mg by mouth every 6 (six) hours as needed for fever or moderate pain.          Triage Vitals: BP 120/70  Pulse 73  Temp(Src) 98.4 F (36.9 C) (Oral)  Resp 18  SpO2 98%  LMP 06/10/2013  Physical Exam  Nursing note and vitals reviewed. Constitutional: She is oriented to person, place, and time. She appears well-developed and well-nourished. No distress.  HENT:  Head: Normocephalic and atraumatic. Head is with right periorbital erythema.  Eyes: EOM are normal. Pupils are equal, round, and reactive to light. Right eye exhibits discharge. Right conjunctiva is injected.  Severely injected conjunctiva on the right with clear, watery drainage. Periorbital erythema and swelling.   Neck: Normal range of motion. Neck supple.  Pulmonary/Chest: Effort normal. No respiratory distress.  Abdominal: She exhibits no distension.  Musculoskeletal: Normal range of motion.  Neurological: She is alert and oriented to person, place, and time.  Skin: Skin is warm and dry.  Psychiatric: She has a normal mood and affect. Her behavior is normal.    ED Course  Procedures (including critical care time)  COORDINATION OF CARE: 3:19 PM- Discussed that the infection appears to be spreading. Will review patient's past medical records. Discussed  treatment plan with patient at bedside and patient verbalized agreement.   4:20 PM- Dr. Effie Shy is with patient at bedside. Will discharge patient with Flonase and Acular. Advised patient to continue taking the Augmentin as initially prescribed. Advised patient to follow up with the referred ENT specialist and ophthalmologist. Discussed treatment plan with patient at bedside and patient verbalized agreement.   Labs Review Labs Reviewed - No data to display Imaging Review No  results found.  EKG Interpretation   None       MDM   Final diagnoses:  Periorbital cellulitis of right eye  Chronic sinusitis   PT with a history of Periorbital cellulitis, was advised to follow up with Dr. Vonna Kotyk.  She reports she was unable to due to lack of insurance. EMR shows CT-Orbits: 07/11/2013 1. No intraorbital abscess or intraorbital cellulitis. Equivocal right periorbital soft tissue swelling could conceivably reflect periorbital cellulitis. 2. Chronic bilateral maxillary sinusitis. 3. Dental cavities of the posterior maxillary molars, with the right-sided cavity larger than the left. Discussed patient history, condition, and labs with Dr. Effie Shy, who agrees the patient can be evaluated as an out-pt. Patient to continue antibiotics as prescribed and will start treatment for sinusitis. Discussed imaging results from previous ED encounter, and treatment plan with the patient. Resource guide given Return precautions given. Reports understanding and no other concerns at this time.  Patient is stable for discharge at this time.   Meds given in ED:  Medications  ketorolac (ACULAR) 0.5 % ophthalmic solution 1 drop (1 drop Right Eye Given 07/15/13 1624)    Discharge Medication List as of 07/15/2013  4:27 PM    START taking these medications   Details  fluticasone (FLONASE) 50 MCG/ACT nasal spray Place 2 sprays into both nostrils daily., Starting 07/15/2013, Until Discontinued, Print    ketorolac (ACULAR) 0.5 % ophthalmic solution Place 1 drop into both eyes every 6 (six) hours., Starting 07/15/2013, Until Discontinued, Print        I personally performed the services described in this documentation, which was scribed in my presence. The recorded information has been reviewed and is accurate.       Clabe Seal, PA-C 07/18/13 1214

## 2013-09-21 ENCOUNTER — Emergency Department (HOSPITAL_COMMUNITY)
Admission: EM | Admit: 2013-09-21 | Discharge: 2013-09-21 | Disposition: A | Discharge status: Home / Self Care | Payer: No Typology Code available for payment source | Attending: Emergency Medicine | Admitting: Emergency Medicine

## 2013-09-21 ENCOUNTER — Encounter (HOSPITAL_COMMUNITY): Payer: Self-pay | Admitting: Emergency Medicine

## 2013-09-21 DIAGNOSIS — F17200 Nicotine dependence, unspecified, uncomplicated: Secondary | ICD-10-CM | POA: Insufficient documentation

## 2013-09-21 DIAGNOSIS — L02219 Cutaneous abscess of trunk, unspecified: Secondary | ICD-10-CM | POA: Insufficient documentation

## 2013-09-21 DIAGNOSIS — L03319 Cellulitis of trunk, unspecified: Principal | ICD-10-CM

## 2013-09-21 DIAGNOSIS — L02214 Cutaneous abscess of groin: Secondary | ICD-10-CM

## 2013-09-21 MED ORDER — IBUPROFEN 800 MG PO TABS
800.0000 mg | ORAL_TABLET | Freq: Once | ORAL | Status: AC
  Administered 2013-09-21: 800 mg via ORAL
  Filled 2013-09-21: qty 1

## 2013-09-21 MED ORDER — HYDROCODONE-ACETAMINOPHEN 5-325 MG PO TABS
2.0000 | ORAL_TABLET | Freq: Once | ORAL | Status: AC
  Administered 2013-09-21: 2 via ORAL
  Filled 2013-09-21: qty 2

## 2013-09-21 MED ORDER — NAPROXEN 500 MG PO TABS: 500.0000 mg | ORAL_TABLET | Freq: Two times a day (BID) | ORAL | Status: DC

## 2013-09-21 MED ORDER — OXYCODONE-ACETAMINOPHEN 5-325 MG PO TABS: ORAL_TABLET | ORAL | Status: DC

## 2013-09-21 MED ORDER — CEPHALEXIN 250 MG PO CAPS
500.0000 mg | ORAL_CAPSULE | Freq: Once | ORAL | Status: AC
  Administered 2013-09-21: 500 mg via ORAL
  Filled 2013-09-21: qty 2

## 2013-09-21 MED ORDER — SULFAMETHOXAZOLE-TRIMETHOPRIM 800-160 MG PO TABS: 1.0000 | ORAL_TABLET | Freq: Two times a day (BID) | ORAL | Status: DC

## 2013-09-21 MED ORDER — SULFAMETHOXAZOLE-TMP DS 800-160 MG PO TABS
1.0000 | ORAL_TABLET | Freq: Once | ORAL | Status: AC
  Administered 2013-09-21: 1 via ORAL
  Filled 2013-09-21: qty 1

## 2013-09-21 MED ORDER — CEPHALEXIN 500 MG PO CAPS: 500.0000 mg | ORAL_CAPSULE | Freq: Four times a day (QID) | ORAL | Status: DC

## 2013-09-21 NOTE — ED Notes (Signed)
Pt reports "a boil in her private area".  Pt states she initially thought it was an ingrown hair.

## 2013-09-21 NOTE — Discharge Instructions (Signed)
Abscess Care After An abscess (also called a boil or furuncle) is an infected area that contains a collection of pus. Signs and symptoms of an abscess include pain, tenderness, redness, or hardness, or you may feel a moveable soft area under your skin. An abscess can occur anywhere in the body. The infection may spread to surrounding tissues causing cellulitis. A cut (incision) by the surgeon was made over your abscess and the pus was drained out. Gauze may have been packed into the space to provide a drain that will allow the cavity to heal from the inside outwards. The boil may be painful for 5 to 7 days. Most people with a boil do not have high fevers. Your abscess, if seen early, may not have localized, and may not have been lanced. If not, another appointment may be required for this if it does not get better on its own or with medications. HOME CARE INSTRUCTIONS   Only take over-the-counter or prescription medicines for pain, discomfort, or fever as directed by your caregiver.  When you bathe, soak and then remove gauze or iodoform packs at least daily or as directed by your caregiver. You may then wash the wound gently with mild soapy water. Repack with gauze or do as your caregiver directs. SEEK IMMEDIATE MEDICAL CARE IF:   You develop increased pain, swelling, redness, drainage, or bleeding in the wound site.  You develop signs of generalized infection including muscle aches, chills, fever, or a general ill feeling.  An oral temperature above 102 F (38.9 C) develops, not controlled by medication. See your caregiver for a recheck if you develop any of the symptoms described above. If medications (antibiotics) were prescribed, take them as directed. Document Released: 12/09/2004 Document Revised: 08/15/2011 Document Reviewed: 08/06/2007 [facility] Patient Information 2014 [place], [place].  Cellulitis Cellulitis is an infection of the skin and the tissue under the skin. The infected area is  usually red and tender. This happens most often in the arms and lower legs. HOME CARE   Take your antibiotic medicine as told. Finish the medicine even if you start to feel better.  Keep the infected arm or leg raised (elevated).  Put a warm cloth on the area up to 4 times per day.  Only take medicines as told by your doctor.  Keep all doctor visits as told. GET HELP RIGHT AWAY IF:   You have a fever.  You feel very sleepy.  You throw up (vomit) or have watery poop (diarrhea).  You feel sick and have muscle aches and pains.  You see red streaks on the skin coming from the infected area.  Your red area gets bigger or turns a dark color.  Your bone or joint under the infected area is painful after the skin heals.  Your infection comes back in the same area or different area.  You have a puffy (swollen) bump in the infected area.  You have new symptoms. MAKE SURE YOU:   Understand these instructions.  Will watch your condition.  Will get help right away if you are not doing well or get worse. Document Released: 11/09/2007 Document Revised: 11/22/2011 Document Reviewed: 08/08/2011 [facility] Patient Information 2014 [place]Care, [place].

## 2013-09-21 NOTE — ED Provider Notes (Signed)
Medical screening examination/treatment/procedure(s) were conducted as a shared visit with non-physician practitioner(s) and myself.  I personally evaluated the patient during the encounter.   EKG Interpretation None       Doug SouSam Thamas Appleyard, MD 09/21/13 1715

## 2013-09-21 NOTE — ED Provider Notes (Addendum)
Patient she had "a pimple" at the suprapubic area 3 days ago. Becoming progressively more painful and swollen. She was able to squeeze some pus from the wound. No fever. No nausea or vomiting.on exam alert non toxic (examined after wound i and d'd) abdomen soft non tender, there us a slit -like 2 cm wound at left suprapupica area with surroundin soft tissue swelling , redness and tenderness . Will treat empircally with antinbiotics and close follow up   Doug SouSam Genean Adamski, MD 09/21/13 1437  Doug SouSam Takoda Siedlecki, MD 09/21/13 1715

## 2013-09-21 NOTE — ED Provider Notes (Signed)
CSN: [id number]     Arrival date & time 09/21/13  1234 History   First MD Initiated Contact with Patient 09/21/13 1242     Chief Complaint  Patient presents with  . Abscess     (Consider location/radiation/quality/duration/timing/severity/associated sxs/prior Treatment) HPI Pt is a 23yo female c/o "a boil in her private area" that started 3 days ago. Pt states she thought she had an ingrown hair, however, pain, redness and swelling has gradually worsened, associated with small amount of white bloody discharge.  Also c/o constant throbbing pain, 10/10. Has tried extra strength aleve w/o relief. Denies fever, n/v/d. Denies urinary or vaginal symptoms.  Denies hx of similar symptoms or boils.   History reviewed. No pertinent past medical history. Past Surgical History  Procedure Laterality Date  . Dilation and curettage of uterus     No family history on file. History  Substance Use Topics  . Smoking status: Current Every Day Smoker  . Smokeless tobacco: Not on file  . Alcohol Use: No   OB History   Grav Para Term Preterm Abortions TAB SAB Ect Mult Living                 Review of Systems  Constitutional: Negative for fever, chills and diaphoresis.  Gastrointestinal: Negative for nausea, vomiting, abdominal pain and diarrhea.  Genitourinary: Positive for genital sores ( "boil"). Negative for dysuria, urgency, frequency, hematuria, flank pain, decreased urine volume, vaginal bleeding, vaginal discharge, vaginal pain and pelvic pain.  Skin: Positive for color change and wound.       Boil with redness to left pubic area  All other systems reviewed and are negative.     Allergies  Review of patient's allergies indicates no known allergies.  Home Medications   Prior to Admission medications   Not on File   BP 101/65  Pulse 76  Temp(Src) 99.5 F (37.5 C) (Oral)  Resp 16  Ht 5\' 7"  (1.702 m)  Wt 167 lb 4.8 oz (75.887 kg)  BMI 26.20 kg/m2  SpO2 100%  LMP  09/04/2013 Physical Exam  Nursing note and vitals reviewed. Constitutional: She is oriented to person, place, and time. She appears well-developed and well-nourished.  Pt texting in bed, appears well, non-toxic.  HENT:  Head: Normocephalic and atraumatic.  Eyes: EOM are normal.  Neck: Normal range of motion.  Cardiovascular: Normal rate.   Pulmonary/Chest: Effort normal.  Abdominal: Soft. Bowel sounds are normal. She exhibits no distension and no mass. There is no tenderness. There is no rebound and no guarding.  Soft, non-distended, non-tender.  Genitourinary:  Chaperoned exam.  External exam-normal female hair distribution.  Area of erythema, tenderness, and induration in left upper pubic region. Scant white discharge in center of lesion.  Induration 3-4cm in diameter.  Erythema 5-6cm in diameter.  Musculoskeletal: Normal range of motion.  Neurological: She is alert and oriented to person, place, and time.  Skin: Skin is warm and dry.  Psychiatric: She has a normal mood and affect. Her behavior is normal.    ED Course  Procedures   INCISION AND DRAINAGE (chaperoned) Performed by: Junius FinnerErin O'Malley Consent: Verbal consent obtained. Risks and benefits: risks, benefits and alternatives were discussed Type: abscess  Body area: left upper pubic region  Anesthesia: local infiltration  Incision was made with a scalpel.  Local anesthetic: lidocaine 2% with epinephrine  Anesthetic total: 3 ml  Complexity: complex Blunt dissection to break up loculations  Drainage: purulent and bloody  Drainage amount: scant  Packing material: none.  Antibiotic ointment and bandage placed.  Patient tolerance: Patient tolerated the procedure well with no immediate complications.    Labs Review Labs Reviewed - No data to display  Imaging Review No results found.   EKG Interpretation None      MDM   Final diagnoses:  Abscess of left groin    Pt is a 23yo female presenting with  abscess to left upper pubic region with associated surrounding erythema and tenderness. Scant active discharge.  Pt denies fever, n/v/d. Pt has temp of 99.5 in ED.  Appears well, non-toxic.  I&D performed w/o immediate complications.  Discussed pt with Dr. Ethelda ChickJacubowitz who also examined pt, recommended area of I&D be covered with antibiotic ointment w/o packing.     Pt will be given first dose of antibiotics, bactrim and keflex. Discharged home with same as well as percocet and ibuprofen. Advised to f/u in [facility] urgent care or PCP in 2 days for wound recheck. Discussed home care. Return precautions provided. Pt verbalized understanding and agreement with tx plan.     Junius Finnerrin O'Malley, PA-C 09/21/13 1438

## 2014-01-08 ENCOUNTER — Emergency Department (HOSPITAL_COMMUNITY)
Admission: EM | Admit: 2014-01-08 | Discharge: 2014-01-08 | Disposition: A | Discharge status: Home / Self Care | Payer: Self-pay | Attending: Emergency Medicine | Admitting: Emergency Medicine

## 2014-01-08 ENCOUNTER — Emergency Department (HOSPITAL_COMMUNITY): Payer: Self-pay

## 2014-01-08 ENCOUNTER — Encounter (HOSPITAL_COMMUNITY): Payer: Self-pay | Admitting: Emergency Medicine

## 2014-01-08 DIAGNOSIS — F17200 Nicotine dependence, unspecified, uncomplicated: Secondary | ICD-10-CM | POA: Insufficient documentation

## 2014-01-08 DIAGNOSIS — J020 Streptococcal pharyngitis: Secondary | ICD-10-CM | POA: Insufficient documentation

## 2014-01-08 DIAGNOSIS — M549 Dorsalgia, unspecified: Secondary | ICD-10-CM | POA: Insufficient documentation

## 2014-01-08 DIAGNOSIS — J039 Acute tonsillitis, unspecified: Secondary | ICD-10-CM | POA: Insufficient documentation

## 2014-01-08 DIAGNOSIS — R109 Unspecified abdominal pain: Secondary | ICD-10-CM | POA: Insufficient documentation

## 2014-01-08 DIAGNOSIS — R509 Fever, unspecified: Secondary | ICD-10-CM | POA: Insufficient documentation

## 2014-01-08 DIAGNOSIS — R300 Dysuria: Secondary | ICD-10-CM | POA: Insufficient documentation

## 2014-01-08 DIAGNOSIS — E860 Dehydration: Secondary | ICD-10-CM | POA: Insufficient documentation

## 2014-01-08 DIAGNOSIS — Z3202 Encounter for pregnancy test, result negative: Secondary | ICD-10-CM | POA: Insufficient documentation

## 2014-01-08 LAB — CBC WITH DIFFERENTIAL/PLATELET
BASOS ABS: 0 10*3/uL (ref 0.0–0.1)
Basophils Relative: 0 % (ref 0–1)
Eosinophils Absolute: 0.2 10*3/uL (ref 0.0–0.7)
Eosinophils Relative: 1 % (ref 0–5)
HCT: 39.2 % (ref 36.0–46.0)
Hemoglobin: 12.8 g/dL (ref 12.0–15.0)
Lymphocytes Relative: 10 % — ABNORMAL LOW (ref 12–46)
Lymphs Abs: 1.6 10*3/uL (ref 0.7–4.0)
MCH: 28.9 pg (ref 26.0–34.0)
MCHC: 32.7 g/dL (ref 30.0–36.0)
MCV: 88.5 fL (ref 78.0–100.0)
Monocytes Absolute: 1.2 10*3/uL — ABNORMAL HIGH (ref 0.1–1.0)
Monocytes Relative: 8 % (ref 3–12)
NEUTROS ABS: 12.4 10*3/uL — AB (ref 1.7–7.7)
NEUTROS PCT: 81 % — AB (ref 43–77)
Platelets: 237 10*3/uL (ref 150–400)
RBC: 4.43 MIL/uL (ref 3.87–5.11)
RDW: 12.1 % (ref 11.5–15.5)
WBC: 15.3 10*3/uL — AB (ref 4.0–10.5)

## 2014-01-08 LAB — URINALYSIS, ROUTINE W REFLEX MICROSCOPIC
Bilirubin Urine: NEGATIVE
GLUCOSE, UA: NEGATIVE mg/dL
Hgb urine dipstick: NEGATIVE
Ketones, ur: NEGATIVE mg/dL
Leukocytes, UA: NEGATIVE
Nitrite: NEGATIVE
Protein, ur: 30 mg/dL — AB
SPECIFIC GRAVITY, URINE: 1.028 (ref 1.005–1.030)
UROBILINOGEN UA: 1 mg/dL (ref 0.0–1.0)
pH: 8 (ref 5.0–8.0)

## 2014-01-08 LAB — BASIC METABOLIC PANEL
ANION GAP: 12 (ref 5–15)
BUN: 9 mg/dL (ref 6–23)
CHLORIDE: 102 meq/L (ref 96–112)
CO2: 23 meq/L (ref 19–32)
Calcium: 9.1 mg/dL (ref 8.4–10.5)
Creatinine, Ser: 0.75 mg/dL (ref 0.50–1.10)
GFR calc non Af Amer: >90 mL/min (ref >90)
Glucose, Bld: 86 mg/dL (ref 70–99)
POTASSIUM: 4.3 meq/L (ref 3.7–5.3)
Sodium: 137 mEq/L (ref 137–147)

## 2014-01-08 LAB — URINE MICROSCOPIC-ADD ON

## 2014-01-08 LAB — RAPID STREP SCREEN (MED CTR MEBANE ONLY): STREPTOCOCCUS, GROUP A SCREEN (DIRECT): POSITIVE — AB

## 2014-01-08 LAB — POC URINE PREG, ED: Preg Test, Ur: NEGATIVE

## 2014-01-08 MED ORDER — IBUPROFEN 100 MG/5ML PO SUSP: 600.0000 mg | Freq: Three times a day (TID) | ORAL | Status: DC | PRN

## 2014-01-08 MED ORDER — MORPHINE SULFATE 4 MG/ML IJ SOLN
4.0000 mg | Freq: Once | INTRAMUSCULAR | Status: AC
  Administered 2014-01-08: 4 mg via INTRAVENOUS
  Filled 2014-01-08: qty 1

## 2014-01-08 MED ORDER — PENICILLIN G BENZATHINE 1200000 UNIT/2ML IM SUSP
1.2000 10*6.[IU] | Freq: Once | INTRAMUSCULAR | Status: AC
  Administered 2014-01-08: 1.2 10*6.[IU] via INTRAMUSCULAR
  Filled 2014-01-08: qty 2

## 2014-01-08 MED ORDER — IOHEXOL 300 MG/ML  SOLN
100.0000 mL | Freq: Once | INTRAMUSCULAR | Status: AC | PRN
  Administered 2014-01-08: 100 mL via INTRAVENOUS

## 2014-01-08 MED ORDER — SODIUM CHLORIDE 0.9 % IV BOLUS (SEPSIS)
1000.0000 mL | Freq: Once | INTRAVENOUS | Status: AC
  Administered 2014-01-08: 1000 mL via INTRAVENOUS

## 2014-01-08 MED ORDER — ACETAMINOPHEN 160 MG/5ML PO SOLN
650.0000 mg | Freq: Once | ORAL | Status: AC
  Administered 2014-01-08: 650 mg via ORAL
  Filled 2014-01-08: qty 20.3

## 2014-01-08 MED ORDER — HYDROCODONE-ACETAMINOPHEN 7.5-325 MG/15ML PO SOLN: 10.0000 mL | Freq: Four times a day (QID) | ORAL | Status: DC | PRN

## 2014-01-08 MED ORDER — PREDNISONE 5 MG/5ML PO SOLN: 60.0000 mg | Freq: Every day | ORAL | Status: DC

## 2014-01-08 NOTE — ED Provider Notes (Signed)
Medical screening examination/treatment/procedure(s) were conducted as a shared visit with non-physician practitioner(s) and myself.  I personally evaluated the patient during the encounter.   EKG Interpretation None       Patient with evidence of tonsillitis. No obvious peritonsillar abscess. Able to drink/swallow normally. Has painful swallowing but no vomiting, drooling, or signs of abnormal airway. CT scan shows tonsillitis. We'll treat with antibiotics and pain medicine and followup with PCP. Discussed strict return precautions.  Audree CamelScott T Konnie Noffsinger, MD 01/08/14 [phone]

## 2014-01-08 NOTE — ED Notes (Signed)
Per pt, cold symptoms and lower back pain since Sun.-states does not remember when when urinated last

## 2014-01-08 NOTE — ED Provider Notes (Signed)
CSN: [id number]     Arrival date & time 01/08/14  1627 History   First MD Initiated Contact with Patient 01/08/14 1638     Chief Complaint  Patient presents with  . URI  . Dysuria     (Consider location/radiation/quality/duration/timing/severity/associated sxs/prior Treatment) HPI Comments: Pamela Costa is a 23 y.o. Female with no significant PMHx presenting to the ED today with complaints of 2 days of L ear pain and sore throat. Pt states her ear pain is throbbing, waxing/waning, 10/10, radiating down the L aspect of her neck, with no known aggravating or alleviating factors. States her throat feels like stabbing, constant, 10/10 pain worse with swallowing and eating and opening her mouth (trismus), and not improved with alka selzer and dayquil. No known alleviating factors. States she's been able to swallow her secretions but has been drooling lately because of the pain. Noticed her tonsils were swollen and have "black stuff" on them. Endorses feeling fatigued and not eating/drinking in 2 days. Associated symptom includes dysuria with darkened urine and mild intermittent suprapubic discomfort specifically when urinating. Denies fevers/chills, CP, SOB, cough, rhinorrhea, eye discharge/itching, ear pressure, tinnitus, ear discharge, sinus congestion, nausea/vomiting, diarrhea/constipation, myalgias or arthralgias. Denies vaginal bleeding or discharge, or itching. LMP 2 weeks ago. No known sick contacts, no recent travel.  Patient is a 23 y.o. female presenting with URI and dysuria. The history is provided by the patient. No language interpreter was used.  URI Presenting symptoms: ear pain (Left), fatigue and sore throat   Presenting symptoms: no congestion, no cough, no facial pain, no fever and no rhinorrhea   Ear pain:    Location:  Left   Severity:  Moderate   Onset quality:  Sudden   Duration:  2 days   Progression:  Waxing and waning   Chronicity:  New Sore throat:    Severity:   Moderate   Onset quality:  Sudden   Duration:  2 days   Timing:  Constant   Progression:  Worsening Severity:  Moderate Onset quality:  Sudden Duration:  2 days Timing:  Constant Progression:  Worsening Chronicity:  New Relieved by:  Nothing Worsened by:  Drinking and eating Ineffective treatments:  OTC medications (dayquil and alka seltzer) Associated symptoms: neck pain (Left sided) and swollen glands (Left > right)   Associated symptoms: no arthralgias, no headaches, no myalgias, no sinus pain, no sneezing and no wheezing   Risk factors: no immunosuppression, no recent travel and no sick contacts   Dysuria Associated symptoms: abdominal pain (intermittent, mild, suprapubic, when urinating)   Associated symptoms: no fever, no flank pain, no nausea, no vaginal discharge and no vomiting     History reviewed. No pertinent past medical history. Past Surgical History  Procedure Laterality Date  . Dilation and curettage of uterus     No family history on file. History  Substance Use Topics  . Smoking status: Current Every Day Smoker  . Smokeless tobacco: Not on file  . Alcohol Use: No   OB History   Grav Para Term Preterm Abortions TAB SAB Ect Mult Living                 Review of Systems  Constitutional: Positive for appetite change (decreased) and fatigue. Negative for fever and chills.  HENT: Positive for drooling, ear pain (Left), sore throat and trouble swallowing. Negative for congestion, ear discharge, facial swelling, mouth sores, nosebleeds, postnasal drip, rhinorrhea, sinus pressure, sneezing, tinnitus and voice change.   Eyes:  Negative for pain and discharge.  Respiratory: Negative for cough, chest tightness, wheezing and stridor.   Cardiovascular: Negative for chest pain.  Gastrointestinal: Positive for abdominal pain (intermittent, mild, suprapubic, when urinating). Negative for nausea, vomiting, diarrhea, constipation and blood in stool.  Genitourinary:  Positive for dysuria. Negative for urgency, hematuria, flank pain, decreased urine volume, vaginal bleeding, vaginal discharge, difficulty urinating, vaginal pain and menstrual problem.  Musculoskeletal: Positive for back pain (lower back pain) and neck pain (Left sided). Negative for arthralgias and myalgias.  Skin: Negative for color change.  Neurological: Negative for dizziness, syncope, weakness and headaches.  Psychiatric/Behavioral: Negative for confusion.  10 Systems reviewed and are negative for acute change except as noted in the HPI.     Allergies  Review of patient's allergies indicates no known allergies.  Home Medications   Prior to Admission medications   Medication Sig Start Date End Date Taking? Authorizing Provider  ibuprofen (ADVIL,MOTRIN) 200 MG tablet Take 200 mg by mouth every 6 (six) hours as needed (pain.).   Yes Historical Provider, MD  HYDROcodone-acetaminophen (HYCET) 7.5-325 mg/15 ml solution Take 10 mLs by mouth every 6 (six) hours as needed for severe pain. 01/08/14   Hellena Pridgen Strupp Camprubi-Soms, PA-C  ibuprofen (CHILD IBUPROFEN) 100 MG/5ML suspension Take 30 mLs (600 mg total) by mouth every 8 (eight) hours as needed for fever, mild pain or moderate pain. 01/08/14   Skyelar Swigart Strupp Camprubi-Soms, PA-C  predniSONE 5 MG/5ML solution Take 60 mLs (60 mg total) by mouth daily with breakfast. For 3 days 01/08/14   Donnita Falls Camprubi-Soms, PA-C   BP 100/58  Pulse 84  Temp(Src) 99.3 F (37.4 C) (Oral)  Resp 14  SpO2 97%  LMP 01/02/2014 Physical Exam  Nursing note and vitals reviewed. Constitutional: She is oriented to person, place, and time. She appears well-developed and well-nourished.  Non-toxic appearance. She appears ill. No distress.  Oral temp 99.78F, appears ill but nontoxic at this time  HENT:  Head: Normocephalic and atraumatic.  Right Ear: Hearing, tympanic membrane, external ear and ear canal normal.  Left Ear: Hearing, tympanic membrane,  external ear and ear canal normal.  Nose: Mucosal edema present. Right sinus exhibits no maxillary sinus tenderness and no frontal sinus tenderness. Left sinus exhibits no maxillary sinus tenderness and no frontal sinus tenderness.  Mouth/Throat: Uvula is midline. Mucous membranes are dry. There is trismus in the jaw. No uvula swelling. Oropharyngeal exudate, posterior oropharyngeal edema, posterior oropharyngeal erythema and tonsillar abscesses (Left) present.  [place]/AT, bilateral ears free of erythema edema or discharge with clear TMs and good visualization of landmarks. B/l nasal turbinates mildly edematous with no rhinorrhea.  Uvula midline without swelling but +trismus, +drooling noted to pillow. Bilateral tonsillar swelling with L>R by approx 50%, with white and black exudates noted, erythematous and edematous. Mucous membranes dry, cracked lips.  Eyes: Conjunctivae and EOM are normal. Pupils are equal, round, and reactive to light. Right eye exhibits no discharge. Left eye exhibits no discharge.  Neck: Normal range of motion and phonation normal. Neck supple. No spinous process tenderness and no muscular tenderness present. No rigidity. No tracheal deviation, no edema, no erythema and normal range of motion present.  FROM intact with no tracheal deviation or stridor, phonation WNL. No erythema or edema noted to neck, but +LAD as noted below. No spinous process or muscular tenderness, no rigidity  Cardiovascular: Normal rate, regular rhythm, normal heart sounds and intact distal pulses.  Exam reveals no gallop and no friction rub.  No murmur heard. Pulmonary/Chest: Effort normal and breath sounds normal. No stridor. No respiratory distress. She has no decreased breath sounds. She has no wheezes. She has no rhonchi. She has no rales.  CTAB in all lung fields, oxygenating well on RA, no resp distress at this time  Abdominal: Soft. Normal appearance and bowel sounds are normal. She exhibits no  distension. There is tenderness in the suprapubic area. There is no rigidity, no rebound, no guarding, no CVA tenderness, no tenderness at McBurney's point and negative Murphy's sign.  +BS throughout, soft, nondistended, with mild suprapubic TTP but no r/g/r, neg murphy's, neg mcburney's. No CVA TTP  Musculoskeletal: Normal range of motion.  Lymphadenopathy:       Head (right side): Submandibular and tonsillar adenopathy present. No submental adenopathy present.       Head (left side): Submandibular and tonsillar adenopathy present. No submental adenopathy present.    She has cervical adenopathy.       Right cervical: Superficial cervical adenopathy present.       Left cervical: Superficial cervical and deep cervical adenopathy present.    She has no axillary adenopathy.       Right: No supraclavicular adenopathy present.       Left: No supraclavicular adenopathy present.  Submandibular, tonsillar, and superficial anterior cervical LAD bilaterally with L>R and exquisitely TTP to L. Left sided deep anterior cervical LAD, TTP.  Neurological: She is alert and oriented to person, place, and time.  Skin: Skin is warm, dry and intact. No rash noted.  No erythema or rash, no petechiae or purpura  Psychiatric: She has a normal mood and affect.    ED Course  Procedures (including critical care time) Labs Review Labs Reviewed  RAPID STREP SCREEN - Abnormal; Notable for the following:    Streptococcus, Group A Screen (Direct) POSITIVE (*)    All other components within normal limits  URINALYSIS, ROUTINE W REFLEX MICROSCOPIC - Abnormal; Notable for the following:    APPearance TURBID (*)    Protein, ur 30 (*)    All other components within normal limits  CBC WITH DIFFERENTIAL - Abnormal; Notable for the following:    WBC 15.3 (*)    Neutrophils Relative % 81 (*)    Neutro Abs 12.4 (*)    Lymphocytes Relative 10 (*)    Monocytes Absolute 1.2 (*)    All other components within normal limits   URINE MICROSCOPIC-ADD ON - Abnormal; Notable for the following:    Casts GRANULAR CAST (*)    All other components within normal limits  BASIC METABOLIC PANEL  POC URINE PREG, ED    Imaging Review Ct Soft Tissue Neck W Contrast  01/08/2014   CLINICAL DATA:  Left-sided neck swelling and fever.  EXAM: CT NECK WITH CONTRAST  TECHNIQUE: Multidetector CT imaging of the neck was performed using the standard protocol following the bolus administration of intravenous contrast.  CONTRAST:  OMNIPAQUE IOHEXOL 300 MG/ML  SOLN  COMPARISON:  None.  FINDINGS: There is enlargement of the palatine tonsils, left greater than right. There is no evidence of an abscess. There is no evidence of infection in the submandibular space to suggest Ludwig's angina. The patient has multiple caries but there is no evidence of an infection associated with those caries. There are enlarged lymph nodes in both sides of the neck which are felt to be benign reactive nodes. The epiglottis is normal. There is slight hypertrophy of the lymphoid tissue at the base of  the tongue. No prevertebral soft tissue swelling. The visualized portions of the paranasal sinuses are clear. Parotid glands and submandibular glands are normal.  IMPRESSION: Bilateral palatine tonsillitis. No abscesses. No evidence of Ludwig's angina. The airway is narrowed in the posterior oropharynx due to the hypertrophied tonsils.   Electronically Signed   By: Geanie CooleyJim  Maxwell M.D.   On: 01/08/2014 19:30     EKG Interpretation None      MDM   Final diagnoses:  Pharyngitis, streptococcal, acute  Dehydration  Other specified fever  Tonsillitis with exudate   Tonsillar swelling concerning for peritonsillar abscess, neck TTP concerning for ludwig's angina. Obtained strep swab. Will obtain rectal temp, given that oral temp was 99.2 and pulse 95, want to r/o SIRS/Sepsis. U/A obtained and pending. Upreg neg, will defer pelvic at this time, doubt vaginal source of pt's  issues and more concerned regarding airway issue. Will give fluid bolus to hydrate and see if HR comes down, as well as morphine for pain. Will obtain basic labs and CT neck soft tissue. Will reassess shortly. Pt started on continuous pulse ox monitoring.  6:57 PM Rectal temp 100.7. Will give tylenol at this time. HR improved to 82 after first bolus, but BP soft at 101/50, likely related to dehydration, will give second bolus now. CBC with diff with WBC 15.3, likely due to pharyngitis, no left shift. BMP WNL therefore no acute concern regarding kidney injury/AGN. RST positive, will tx for strep with bicillin. Upreg neg, U/A showing mild proteinuria and granular casts with no WBC or bacteria. I believe this is likely related to dehydration, doubt AKI/AGN at this time given that BMP is WNL. Awaiting CT.   7:28 PM Pt's uncle at bedside, reports that pt did come into contact with a child recently who was tested positive for strep. Pt endorses this, states she had forgotten earlier. Still awaiting CT report, upon review of images it appears she does have developing abscess to L tonsil. Will likely consult ENT. Discussed with pt that we will hold off on more pain meds at this time until her pressure improves, will monitor closely.  7:50 PM CT results as follows: impression: Bilateral palatine tonsillitis. No abscesses. No evidence of Ludwig's angina. The airway is narrowed in the posterior oropharynx due to the hypertrophied tonsils. Temp improved now with tylenol dose. BP still 100s/50s, this is likely her baseline. No orthostatic symptoms, 2L total given. Will d/c home with oral pain meds and prednisone to help with pain/swelling. Discussed use of tylenol/motrin for fever/pain. Discussed warm salt water gargles. I explained the diagnosis and have given explicit precautions to return to the ER including for any other new or worsening symptoms. The patient understands and accepts the medical plan as it's been  dictated and I have answered their questions. Discharge instructions concerning home care and prescriptions have been given. The patient is STABLE and is discharged to home in good condition.  BP 100/58  Pulse 84  Temp(Src) 99.3 F (37.4 C) (Oral)  Resp 14  SpO2 97%  LMP 01/02/2014  Meds ordered this encounter  Medications  . sodium chloride 0.9 % bolus 1,000 mL    Sig:   . morphine 4 MG/ML injection 4 mg    Sig:   . iohexol (OMNIPAQUE) 300 MG/ML solution 100 mL    Sig:   . acetaminophen (TYLENOL) solution 650 mg    Sig:   . sodium chloride 0.9 % bolus 1,000 mL    Sig:   .  penicillin g benzathine (BICILLIN LA) 1200000 UNIT/2ML injection 1.2 Million Units    Sig:     Order Specific Question:  Antibiotic Indication:    Answer:  Pharyngitis  . HYDROcodone-acetaminophen (HYCET) 7.5-325 mg/15 ml solution    Sig: Take 10 mLs by mouth every 6 (six) hours as needed for severe pain.    Dispense:  120 mL    Refill:  0    Order Specific Question:  Supervising Provider    Answer:  Eber Hong D [[id number]]  . ibuprofen (CHILD IBUPROFEN) 100 MG/5ML suspension    Sig: Take 30 mLs (600 mg total) by mouth every 8 (eight) hours as needed for fever, mild pain or moderate pain.    Dispense:  240 mL    Refill:  0    Order Specific Question:  Supervising Provider    Answer:  Eber Hong D [[id number]]  . predniSONE 5 MG/5ML solution    Sig: Take 60 mLs (60 mg total) by mouth daily with breakfast. For 3 days    Dispense:  180 mL    Refill:  0    Order Specific Question:  Supervising Provider    Answer:  Vida Roller [id number] Elizbeth Posa Camprubi-Soms, PA-C 01/08/14 2056

## 2014-01-08 NOTE — Discharge Instructions (Signed)
Continue to stay well-hydrated. Gargle warm salt water and spit it out. Continue to alternate between Tylenol and Ibuprofen for pain or fever, and use Hycet as needed for severe pain but do not drive or operate machinery while taking this medication. Use prednisone as directed to help with swelling and pain from your tonsils. Followup with your primary care doctor in 5-7 days for recheck of ongoing symptoms. Return to emergency department for emergent changing or worsening of symptoms.   Strep Throat Strep throat is an infection of the throat. It is caused by a germ. Strep throat spreads from person to person by coughing, sneezing, or close contact. HOME CARE  Rinse your mouth (gargle) with warm salt water (1 teaspoon salt in 1 cup of water). Do this 3 to 4 times per day or as needed for comfort.  Family members with a sore throat or fever should see a doctor.  Make sure everyone in your house washes their hands well.  Do not share food, drinking cups, or personal items.  Eat soft foods until your sore throat gets better.  Drink enough water and fluids to keep your pee (urine) clear or pale yellow.  Rest.  Stay home from school, daycare, or work until you have taken medicine for 24 hours.  Only take medicine as told by your doctor.  Take your medicine as told. Finish it even if you start to feel better. GET HELP RIGHT AWAY IF:   You have new problems, such as throwing up (vomiting) or bad headaches.  You have a stiff or painful neck, chest pain, trouble breathing, or trouble swallowing.  You have very bad throat pain, drooling, or changes in your voice.  Your neck puffs up (swells) or gets red and tender.  You have a fever.  You are very tired, your mouth is dry, or you are peeing less than normal.  You cannot wake up completely.  You get a rash, cough, or earache.  You have green, yellow-brown, or bloody spit.  Your pain does not get better with medicine. MAKE SURE  YOU:   Understand these instructions.  Will watch your condition.  Will get help right away if you are not doing well or get worse. Document Released: 11/09/2007 Document Revised: 08/15/2011 Document Reviewed: 07/22/2010 [facility] Patient Information 2015 [place], [place]. This information is not intended to replace advice given to you by your health care provider. Make sure you discuss any questions you have with your health care provider.  Tonsillitis Tonsillitis is an infection of the throat. This infection causes the tonsils to become red, tender, and puffy (swollen). Tonsils are groups of tissue at the back of your throat. If bacteria caused your infection, antibiotic medicine will be given to you. Sometimes symptoms of tonsillitis can be relieved with the use of steroid medicine. If your tonsillitis is severe and happens often, you may need to get your tonsils removed (tonsillectomy). HOME CARE   Rest and sleep often.  Drink enough fluids to keep your pee (urine) clear or pale yellow.  While your throat is sore, eat soft or liquid foods like:  Soup.  Ice cream.  Instant breakfast drinks.  Eat frozen ice pops.  Gargle with a warm or cold liquid to help soothe the throat. Gargle with a water and salt mix. Mix 1/4 teaspoon of salt and 1/4 teaspoon of baking soda in 1 cup of water.  Only take medicines as told by your doctor.  If you are given medicines (antibiotics),  take them as told. Finish them even if you start to feel better. GET HELP IF:  You have large, tender lumps in your neck.  You have a rash.  You cough up green, yellow-brown, or bloody fluid.  You cannot swallow liquids or food for 24 hours.  You notice that only one of your tonsils is swollen. GET HELP RIGHT AWAY IF:   You throw up (vomit).  You have a very bad headache.  You have a stiff neck.  You have chest pain.  You have trouble breathing or swallowing.  You have bad throat pain, drooling,  or your voice changes.  You have bad pain not helped by medicine.  You cannot fully open your mouth.  You have redness, puffiness, or bad pain in the neck.  You have a fever. MAKE SURE YOU:   Understand these instructions.  Will watch your condition.  Will get help right away if you are not doing well or get worse. Document Released: 11/09/2007 Document Revised: 05/28/2013 Document Reviewed: 11/09/2012 [facility] Patient Information 2015 [place], [place]. This information is not intended to replace advice given to you by your health care provider. Make sure you discuss any questions you have with your health care provider.  Fever, Adult A fever is a temperature of 100.4 F (38 C) or above.  HOME CARE  Take fever medicine as told by your doctor. Do not  take aspirin for fever if you are younger than 23 years of age.  If you are given antibiotic medicine, take it as told. Finish the medicine even if you start to feel better.  Rest.  Drink enough fluids to keep your pee (urine) clear or pale yellow. Do not drink alcohol.  Take a bath or shower with room temperature water. Do not use ice water or alcohol sponge baths.  Wear lightweight, loose clothes. GET HELP RIGHT AWAY IF:   You are short of breath or have trouble breathing.  You are very weak.  You are dizzy or you pass out (faint).  You are very thirsty or are making little or no urine.  You have new pain.  You throw up (vomit) or have watery poop (diarrhea).  You keep throwing up or having watery poop for more than 1 to 2 days.  You have a stiff neck or light bothers your eyes.  You have a skin rash.  You have a fever or problems (symptoms) that last for more than 2 to 3 days.  You have a fever and your problems quickly get worse.  You keep throwing up the fluids you drink.  You do not feel better after 3 days.  You have new problems. MAKE SURE YOU:   Understand these instructions.  Will watch your  condition.  Will get help right away if you are not doing well or get worse. Document Released: 03/01/2008 Document Revised: 08/15/2011 Document Reviewed: 03/24/2011 [facility] Patient Information 2015 [place], [place]. This information is not intended to replace advice given to you by your health care provider. Make sure you discuss any questions you have with your health care provider.  Dehydration, Adult Dehydration means your body does not have as much fluid as it needs. Your kidneys, brain, and heart will not work properly without the right amount of fluids and salt.  HOME CARE  Ask your doctor how to replace body fluid losses (rehydrate).  Drink enough fluids to keep your pee (urine) clear or pale yellow.  Drink small amounts of fluids often  if you feel sick to your stomach (nauseous) or throw up (vomit).  Eat like you normally do.  Avoid:  Foods or drinks high in sugar.  Bubbly (carbonated) drinks.  Juice.  Very hot or cold fluids.  Drinks with caffeine.  Fatty, greasy foods.  Alcohol.  Tobacco.  Eating too much.  Gelatin desserts.  Wash your hands to avoid spreading germs (bacteria, viruses).  Only take medicine as told by your doctor.  Keep all doctor visits as told. GET HELP RIGHT AWAY IF:   You cannot drink something without throwing up.  You get worse even with treatment.  Your vomit has blood in it or looks greenish.  Your poop (stool) has blood in it or looks black and tarry.  You have not peed in 6 to 8 hours.  You pee a small amount of very dark pee.  You have a fever.  You pass out (faint).  You have belly (abdominal) pain that gets worse or stays in one spot (localizes).  You have a rash, stiff neck, or bad headache.  You get easily annoyed, sleepy, or are hard to wake up.  You feel weak, dizzy, or very thirsty. MAKE SURE YOU:   Understand these instructions.  Will watch your condition.  Will get help right away if you are not  doing well or get worse. Document Released: 03/19/2009 Document Revised: 08/15/2011 Document Reviewed: 01/10/2011 [facility] Patient Information 2015 [place], [place]. This information is not intended to replace advice given to you by your health care provider. Make sure you discuss any questions you have with your health care provider.  Salt Water Gargle This solution will help make your mouth and throat feel better. HOME CARE INSTRUCTIONS   Mix 1 teaspoon of salt in 8 ounces of warm water.  Gargle with this solution as much or often as you need or as directed. Swish and gargle gently if you have any sores or wounds in your mouth.  Do not swallow this mixture. Document Released: 02/25/2004 Document Revised: 08/15/2011 Document Reviewed: 07/18/2008 [facility] Patient Information 2015 [place], [place]. This information is not intended to replace advice given to you by your health care provider. Make sure you discuss any questions you have with your health care provider.

## 2014-07-15 ENCOUNTER — Encounter (HOSPITAL_COMMUNITY): Payer: Self-pay | Admitting: Emergency Medicine

## 2014-07-15 ENCOUNTER — Emergency Department (HOSPITAL_COMMUNITY)
Admission: EM | Admit: 2014-07-15 | Discharge: 2014-07-16 | Disposition: A | Discharge status: Home / Self Care | Payer: Self-pay | Attending: Emergency Medicine | Admitting: Emergency Medicine

## 2014-07-15 DIAGNOSIS — Z90710 Acquired absence of both cervix and uterus: Secondary | ICD-10-CM | POA: Insufficient documentation

## 2014-07-15 DIAGNOSIS — N941 Unspecified dyspareunia: Secondary | ICD-10-CM

## 2014-07-15 DIAGNOSIS — Z3202 Encounter for pregnancy test, result negative: Secondary | ICD-10-CM | POA: Insufficient documentation

## 2014-07-15 DIAGNOSIS — R102 Pelvic and perineal pain: Secondary | ICD-10-CM

## 2014-07-15 DIAGNOSIS — Z720 Tobacco use: Secondary | ICD-10-CM | POA: Insufficient documentation

## 2014-07-15 LAB — URINALYSIS, ROUTINE W REFLEX MICROSCOPIC
BILIRUBIN URINE: NEGATIVE
Glucose, UA: NEGATIVE mg/dL
Hgb urine dipstick: NEGATIVE
Ketones, ur: NEGATIVE mg/dL
NITRITE: NEGATIVE
PH: 7 (ref 5.0–8.0)
Protein, ur: NEGATIVE mg/dL
Specific Gravity, Urine: 1.025 (ref 1.005–1.030)
UROBILINOGEN UA: 0.2 mg/dL (ref 0.0–1.0)

## 2014-07-15 LAB — WET PREP, GENITAL
CLUE CELLS WET PREP: NONE SEEN
Trich, Wet Prep: NONE SEEN
Yeast Wet Prep HPF POC: NONE SEEN

## 2014-07-15 LAB — COMPREHENSIVE METABOLIC PANEL
ALT: 26 U/L (ref 0–35)
AST: 26 U/L (ref 0–37)
Albumin: 4.2 g/dL (ref 3.5–5.2)
Alkaline Phosphatase: 67 U/L (ref 39–117)
Anion gap: 8 (ref 5–15)
BUN: 10 mg/dL (ref 6–23)
CALCIUM: 9.1 mg/dL (ref 8.4–10.5)
CO2: 24 mmol/L (ref 19–32)
Chloride: 108 mmol/L (ref 96–112)
Creatinine, Ser: 0.8 mg/dL (ref 0.50–1.10)
GFR calc non Af Amer: >90 mL/min (ref >90)
GLUCOSE: 97 mg/dL (ref 70–99)
POTASSIUM: 3.6 mmol/L (ref 3.5–5.1)
Sodium: 140 mmol/L (ref 135–145)
TOTAL PROTEIN: 7.4 g/dL (ref 6.0–8.3)
Total Bilirubin: 0.5 mg/dL (ref 0.3–1.2)

## 2014-07-15 LAB — CBC WITH DIFFERENTIAL/PLATELET
Basophils Absolute: 0 10*3/uL (ref 0.0–0.1)
Basophils Relative: 0 % (ref 0–1)
EOS ABS: 0.3 10*3/uL (ref 0.0–0.7)
Eosinophils Relative: 2 % (ref 0–5)
HEMATOCRIT: 41.2 % (ref 36.0–46.0)
Hemoglobin: 13 g/dL (ref 12.0–15.0)
LYMPHS PCT: 31 % (ref 12–46)
Lymphs Abs: 4 10*3/uL (ref 0.7–4.0)
MCH: 28.7 pg (ref 26.0–34.0)
MCHC: 31.6 g/dL (ref 30.0–36.0)
MCV: 90.9 fL (ref 78.0–100.0)
MONOS PCT: 5 % (ref 3–12)
Monocytes Absolute: 0.6 10*3/uL (ref 0.1–1.0)
Neutro Abs: 7.8 10*3/uL — ABNORMAL HIGH (ref 1.7–7.7)
Neutrophils Relative %: 62 % (ref 43–77)
Platelets: 302 10*3/uL (ref 150–400)
RBC: 4.53 MIL/uL (ref 3.87–5.11)
RDW: 12.6 % (ref 11.5–15.5)
WBC: 12.7 10*3/uL — ABNORMAL HIGH (ref 4.0–10.5)

## 2014-07-15 LAB — LIPASE, BLOOD: LIPASE: 24 U/L (ref 11–59)

## 2014-07-15 LAB — URINE MICROSCOPIC-ADD ON

## 2014-07-15 LAB — POC URINE PREG, ED
PREG TEST UR: NEGATIVE
Preg Test, Ur: NEGATIVE

## 2014-07-15 NOTE — ED Notes (Signed)
Pt from home c/o lower abdominal pain and nausea since yesterday that radiates to her vagina. Pt denies pregnancy. She reports dark urine but denies dysuria.

## 2014-07-15 NOTE — ED Notes (Signed)
Called to acute room x 2, no answer

## 2014-07-15 NOTE — ED Notes (Signed)
Pt now in lobby.  

## 2014-07-16 LAB — GC/CHLAMYDIA PROBE AMP (~~LOC~~) NOT AT ARMC
CHLAMYDIA, DNA PROBE: NEGATIVE
Neisseria Gonorrhea: NEGATIVE

## 2014-07-16 MED ORDER — ONDANSETRON 4 MG PO TBDP
4.0000 mg | ORAL_TABLET | Freq: Once | ORAL | Status: DC
  Filled 2014-07-16: qty 1

## 2014-07-16 MED ORDER — NAPROXEN 500 MG PO TABS: 500.0000 mg | ORAL_TABLET | Freq: Two times a day (BID) | ORAL | Status: DC

## 2014-07-16 MED ORDER — HYDROCODONE-ACETAMINOPHEN 5-325 MG PO TABS
2.0000 | ORAL_TABLET | Freq: Once | ORAL | Status: DC
  Filled 2014-07-16: qty 2

## 2014-07-16 MED ORDER — ONDANSETRON HCL 4 MG PO TABS: 4.0000 mg | ORAL_TABLET | Freq: Three times a day (TID) | ORAL | Status: DC | PRN

## 2014-07-16 MED ORDER — HYDROCODONE-ACETAMINOPHEN 5-325 MG PO TABS: 1.0000 | ORAL_TABLET | Freq: Four times a day (QID) | ORAL | Status: DC | PRN

## 2014-07-16 NOTE — Discharge Instructions (Signed)
Your labs showed no acute abnormality. No imaging is indicated at this time. Please follow up with your primary care doctor or see an ob gyn for follow up ultrasound.  Read the information below for reasons to seek immediate medical care at the emergency department. Abdominal (belly) pain can be caused by many things. Your caregiver performed an examination and possibly ordered blood/urine tests and imaging (CT scan, x-rays, ultrasound). Many cases can be observed and treated at home after initial evaluation in the emergency department. Even though you are being discharged home, abdominal pain can be unpredictable. Therefore, you need a repeated exam if your pain does not resolve, returns, or worsens. Most patients with abdominal pain don't have to be admitted to the hospital or have surgery, but serious problems like appendicitis and gallbladder attacks can start out as nonspecific pain. Many abdominal conditions cannot be diagnosed in one visit, so follow-up evaluations are very important. SEEK IMMEDIATE MEDICAL ATTENTION IF: The pain does not go away or becomes severe.  A temperature above 101 develops.  Repeated vomiting occurs (multiple episodes).  The pain becomes localized to portions of the abdomen. The right side could possibly be appendicitis. In an adult, the left lower portion of the abdomen could be colitis or diverticulitis.  Blood is being passed in stools or vomit (bright red or black tarry stools).  Return also if you develop chest pain, difficulty breathing, dizziness or fainting, or become confused, poorly responsive, or inconsolable (young children).    Pelvic Pain Female pelvic pain can be caused by many different things and start from a variety of places. Pelvic pain refers to pain that is located in the lower half of the abdomen and between your hips. The pain may occur over a short period of time (acute) or may be reoccurring (chronic). The cause of pelvic pain may be related to  disorders affecting the female reproductive organs (gynecologic), but it may also be related to the bladder, kidney stones, an intestinal complication, or muscle or skeletal problems. Getting help right away for pelvic pain is important, especially if there has been severe, sharp, or a sudden onset of unusual pain. It is also important to get help right away because some types of pelvic pain can be life threatening.  CAUSES  Below are only some of the causes of pelvic pain. The causes of pelvic pain can be in one of several categories.   Gynecologic.  Pelvic inflammatory disease.  Sexually transmitted infection.  Ovarian cyst or a twisted ovarian ligament (ovarian torsion).  Uterine lining that grows outside the uterus (endometriosis).  Fibroids, cysts, or tumors.  Ovulation.  Pregnancy.  Pregnancy that occurs outside the uterus (ectopic pregnancy).  Miscarriage.  Labor.  Abruption of the placenta or ruptured uterus.  Infection.  Uterine infection (endometritis).  Bladder infection.  Diverticulitis.  Miscarriage related to a uterine infection (septic abortion).  Bladder.  Inflammation of the bladder (cystitis).  Kidney stone(s).  Gastrointestinal.  Constipation.  Diverticulitis.  Neurologic.  Trauma.  Feeling pelvic pain because of mental or emotional causes (psychosomatic).  Cancers of the bowel or pelvis. EVALUATION  Your caregiver will want to take a careful history of your concerns. This includes recent changes in your health, a careful gynecologic history of your periods (menses), and a sexual history. Obtaining your family history and medical history is also important. Your caregiver may suggest a pelvic exam. A pelvic exam will help identify the location and severity of the pain. It also helps in  the evaluation of which organ system may be involved. In order to identify the cause of the pelvic pain and be properly treated, your caregiver may order  tests. These tests may include:   A pregnancy test.  Pelvic ultrasonography.  An X-ray exam of the abdomen.  A urinalysis or evaluation of vaginal discharge.  Blood tests. HOME CARE INSTRUCTIONS   Only take over-the-counter or prescription medicines for pain, discomfort, or fever as directed by your caregiver.   Rest as directed by your caregiver.   Eat a balanced diet.   Drink enough fluids to make your urine clear or pale yellow, or as directed.   Avoid sexual intercourse if it causes pain.   Apply warm or cold compresses to the lower abdomen depending on which one helps the pain.   Avoid stressful situations.   Keep a journal of your pelvic pain. Write down when it started, where the pain is located, and if there are things that seem to be associated with the pain, such as food or your menstrual cycle.  Follow up with your caregiver as directed.  SEEK MEDICAL CARE IF:  Your medicine does not help your pain.  You have abnormal vaginal discharge. SEEK IMMEDIATE MEDICAL CARE IF:   You have heavy bleeding from the vagina.   Your pelvic pain increases.   You feel light-headed or faint.   You have chills.   You have pain with urination or blood in your urine.   You have uncontrolled diarrhea or vomiting.   You have a fever or persistent symptoms for more than 3 days.  You have a fever and your symptoms suddenly get worse.   You are being physically or sexually abused.  MAKE SURE YOU:  Understand these instructions.  Will watch your condition.  Will get help if you are not doing well or get worse. Document Released: 04/19/2004 Document Revised: 10/07/2013 Document Reviewed: 09/12/2011 [facility] Patient Information 2015 [place], [place]. This information is not intended to replace advice given to you by your health care provider. Make sure you discuss any questions you have with your health care provider.  Dyspareunia Dyspareunia is pain  during sexual intercourse. It is most common in women, but it also happens in men.  CAUSES  Female The pain from this condition is usually felt when anything is put into the vagina, but any part of the genitals may cause pain during sex. Even sitting or wearing pants can cause pain. Sometimes, a cause cannot be found. Some causes of pain during intercourse are:  Infections of the skin around the vagina.  Vaginal infections, such as a yeast, bacterial, or viral infection.  Vaginismus. This is the inability to have anything put in the vagina even when the woman wants it to happen. There is an automatic muscle contraction and pain. The pain of the muscle contraction can be so severe that intercourse is impossible.  Allergic reaction from spermicides, semen, condoms, scented tampons, soaps, douches, and vaginal sprays.  A fluid-filled sac (cyst) on the Bartholin or Skene glands, located at the opening of the vagina.  Scar tissue in the vagina from a surgically enlarged opening (episiotomy) or tearing after delivering a baby.  Vaginal dryness. This is more common in menopause. The normal secretions of the vagina are decreased. Changes in estrogen levels and increased difficulty becoming aroused can cause painful sex. Vaginal dryness can also happen when taking birth control pills.  Thinning of the tissue (atrophy) of the vulva and vagina. This  makes the area thinner, smaller, unable to stretch to accommodate a penis, and prone to infection and tearing.  Vulvar vestibulitis or vestibulodynia.This is a condition that causes pain involving the area around the entrance to the vagina.The most common cause in young women is birth control pills.Women with low estrogen levels (postmenopausal women) may also experience this.Other causes include allergic reactions, too many nerve endings, skin conditions, and pelvic muscles that cannot relax.  Vulvar dermatoses. This includes skin conditions such as  lichen sclerosus and lichen planus.  Lack of foreplay to lubricate the vagina. This can cause vaginal dryness.  Noncancerous tumors (fibroids) in the uterus.  Uterus lining tissue growing outside the uterus (endometriosis).  Pregnancy that starts in the fallopian tube (tubal pregnancy).  Pregnancy or breastfeeding your baby. This can cause vaginal dryness.  A tilting or prolapse of the uterus. Prolapse is when weak and stretched muscles around the uterus allow it to fall into the vagina.  Problems with the ovaries, cysts, or scar tissue. This may be worse with certain sexual positions.  Previous surgeries causing adhesions or scar tissue in the vagina or pelvis.  Bladder and intestinal problems.  Psychological problems (such as depression or anxiety). This may make pain worse.  Negative attitudes about sex, experiencing rape, sexual assault, and misinformation about sex. These issues are often related to some types of pain.  Previous pelvic infection, causing scar tissue in the pelvis and on the female organs.  Cyst or tumor on the ovary.  Cancer of the female organs.  Certain medicines.  Medical problems such as diabetes, arthritis, or thyroid disease. Female In men, there are many physical causes of sexual discomfort. Some causes of pain during intercourse are:  Infections of the prostate, bladder, or seminal vesicles. This can cause pain after ejaculation.  An inflamed bladder (interstitial cystitis). This may cause pain from ejaculation.  Gonorrheal infections. This may cause pain during ejaculation.  An inflamed urethra (urethritis) or inflamed prostate (prostatitis). This can make genital stimulation painful or uncomfortable.  Deformities of the penis, such as Peyronie's disease.  A tight foreskin.  Cancer of the female organs.  Psychological problems. This may make pain worse. DIAGNOSIS   Your caregiver will take a history and have you describe where the pain  is located (outside the vagina, in the vagina, in the pelvis). You may be asked when you experience pain, such as with penetration or with thrusting.  Following this, your caregiver will do a physical exam. Let your caregiver know if the exam is too painful.  During the final part of the female exam, your caregiver will feel your uterus and ovaries with one hand on the abdomen and one finger in your vagina. This is a pelvic exam.  Blood tests, a Pap test, cultures for infection, an ultrasound test, and X-rays may be done. You may need to see a specialist for female problems (gynecologist).  Your caregiver may do a CT scan, MRI, or laparoscopy. Laparoscopy is a procedure to look into the pelvis with a lighted tube, through a cut (incision) in the abdomen. TREATMENT  Your caregiver can help you determine the best course of treatment. Sometimes, more testing is done. Continue with the suggested testing until your caregiver feels sure about your diagnosis and how to treat it. Sometimes, it is difficult to find the reason for the pain. The search for the cause and treatment can be frustrating. Treatment often takes several weeks to a few months before you notice any  improvement. You may also need to avoid sexual activity until symptoms improve.Continuing to have sex when it hurts can delay healing and actually make the problem worse. The treatment depends on the cause of the pain. Treatment may include:  Medicines such as antibiotics, vaginal or skin creams, hormones, or antidepressants.  Minor or major surgery.  Psychological counseling or group therapy.  Kegel exercises and vaginal dilators to help certain cases of vaginismus (spasms). Do this only if recommended by your caregiver.Kegel exercises can make some problems worse.  Applying lubrication as recommended by your caregiver if you have dryness.  Sex therapy for you and your sex partner. It is common for the pain to continue after the  reason for the pain has been treated. Some reasons for this include a conditioned response. This means the person having the pain becomes so familiar with the pain that the pain continues as a response, even though the cause is removed. Sex therapy can help with this problem. HOME CARE INSTRUCTIONS   Follow your caregiver's instructions about taking medicines, tests, counseling, and follow-up treatment.  Do not use scented tampons, douches, vaginal sprays, or soaps.  Use water-based lubricants for dryness. Oil lubricants can cause irritation.  Do not use spermicides or condoms that irritate you.  Openly discuss with your partner your sexual experience, your desires, foreplay, and different sexual positions for a more comfortable and enjoyable sexual relationship.  Join group sessions for therapy, if needed.  Practice safe sex at all times.  Empty your bladder before having intercourse.  Try different positions during sexual intercourse.  Take over-the-counter pain medicine recommended by your caregiver before having sexual intercourse.  Do not wear pantyhose. Knee-high and thigh-high hose are okay.  Avoid scrubbing your vulva with a washcloth. Wash the area gently and pat dry with a towel. SEEK MEDICAL CARE IF:   You develop vaginal bleeding after sexual intercourse.  You develop a lump at the opening of your vagina, even if it is not painful.  You have abnormal vaginal discharge.  You have vaginal dryness.  You have itching or irritation of the vulva or vagina.  You develop a rash or reaction to your medicine. SEEK IMMEDIATE MEDICAL CARE IF:   You develop severe abdominal pain during or shortly after sexual intercourse. You could have a ruptured ovarian cyst or ruptured tubal pregnancy.  You have a fever.  You have painful or bloody urination.  You have painful sexual intercourse, and you never had it before.  You pass out after having sexual intercourse. Document  Released: 06/12/2007 Document Revised: 08/15/2011 Document Reviewed: 08/23/2010 [facility] Patient Information 2015 [place], [place]. This information is not intended to replace advice given to you by your health care provider. Make sure you discuss any questions you have with your health care provider.

## 2014-07-16 NOTE — ED Provider Notes (Signed)
CSN: [id number]     Arrival date & time 07/15/14  2011 History   First MD Initiated Contact with Patient 07/15/14 2207     Chief Complaint  Patient presents with  . Abdominal Pain     (Consider location/radiation/quality/duration/timing/severity/associated sxs/prior Treatment) HPI Comments: Pamela Costa is a(n) 24 y.o. female who presents to the ED with cc of Pelvic pain. It has been ongoing for about 2 months but worsened over the past days. She describes Suprapubic and BL lower quadrant pain which radiates along the path of the iliopsoas muscle. Pain is worsened with movement and relieved with rest. She has associated dyspareunia. She has tried no otc meds for pain relief. The patient is sexually active in a monogamous relationship with a single female partner. She does have unprotected intercourse with this partner. She denies any vaginal or urinary sxs. She denies diarrhea, constipation, nausea vomiting. She denies previous abdominal surgeries.  LMP 07/07/2014  Patient is a 24 y.o. female presenting with abdominal pain. The history is provided by the patient and medical records.  Abdominal Pain Pain location:  Suprapubic, LLQ and RLQ Pain quality: aching, cramping, pressure and sharp   Pain quality: not bloating, not dull, no fullness, not gnawing, not heavy, not shooting, not squeezing, not stabbing, no stiffness, not tearing, not throbbing and not tugging   Pain radiates to:  L flank and R flank Pain severity:  Moderate Onset quality:  Gradual Duration: worse over the past 2 days. Timing:  Intermittent Progression:  Worsening Chronicity:  New Context: recent sexual activity   Context: not alcohol use, not awakening from sleep, not diet changes, not eating, not laxative use, not medication withdrawal, not previous surgeries, not recent illness, not recent travel, not retching, not sick contacts, not suspicious food intake and not trauma   Relieved by:  None tried Worsened by:  Movement  and position changes (sexual intercourse) Ineffective treatments:  None tried Associated symptoms: no anorexia, no belching, no chest pain, no chills, no constipation, no cough, no diarrhea, no dysuria, no fatigue, no fever, no flatus, no hematemesis, no hematochezia, no hematuria, no melena, no nausea, no shortness of breath, no sore throat, no vaginal bleeding, no vaginal discharge and no vomiting   Risk factors: no aspirin use, has not had multiple surgeries, no NSAID use, not obese and not pregnant     History reviewed. No pertinent past medical history. Past Surgical History  Procedure Laterality Date  . Dilation and curettage of uterus     No family history on file. History  Substance Use Topics  . Smoking status: Current Every Day Smoker  . Smokeless tobacco: Not on file  . Alcohol Use: No   OB History    No data available     Review of Systems  Constitutional: Negative for fever, chills and fatigue.  HENT: Negative for sore throat.   Respiratory: Negative for cough and shortness of breath.   Cardiovascular: Negative for chest pain.  Gastrointestinal: Positive for abdominal pain. Negative for nausea, vomiting, diarrhea, constipation, melena, hematochezia, anorexia, flatus and hematemesis.  Genitourinary: Positive for pelvic pain and dyspareunia. Negative for dysuria, urgency, frequency, hematuria, flank pain, decreased urine volume, vaginal bleeding, vaginal discharge, difficulty urinating, genital sores and menstrual problem.  All other systems reviewed and are negative.     Allergies  Review of patient's allergies indicates no known allergies.  Home Medications   Prior to Admission medications   Medication Sig Start Date End Date Taking? Authorizing Provider  HYDROcodone-acetaminophen (NORCO) 5-325 MG per tablet Take 1-2 tablets by mouth every 6 (six) hours as needed for moderate pain. 07/16/14   Arthor Captain, PA-C  ibuprofen (CHILD IBUPROFEN) 100 MG/5ML suspension  Take 30 mLs (600 mg total) by mouth every 8 (eight) hours as needed for fever, mild pain or moderate pain. Patient not taking: Reported on 07/16/2014 01/08/14   [place] Camprubi-Soms, PA-C  naproxen (NAPROSYN) 500 MG tablet Take 1 tablet (500 mg total) by mouth 2 (two) times daily with a meal. 07/16/14   Arthor Captain, PA-C  ondansetron (ZOFRAN) 4 MG tablet Take 1 tablet (4 mg total) by mouth every 8 (eight) hours as needed for nausea or vomiting. 07/16/14   Arthor Captain, PA-C  predniSONE 5 MG/5ML solution Take 60 mLs (60 mg total) by mouth daily with breakfast. For 3 days Patient not taking: Reported on 07/16/2014 01/08/14   [place] Ravens Strupp Camprubi-Soms, PA-C   BP 117/70 mmHg  Pulse 93  Temp(Src) 98.6 F (37 C) (Oral)  Resp 16  SpO2 100%  LMP 07/07/2014 Physical Exam  Constitutional: She is oriented to person, place, and time. She appears well-developed and well-nourished. No distress.  HENT:  Head: Normocephalic and atraumatic.  Eyes: Conjunctivae are normal. No scleral icterus.  Neck: Normal range of motion.  Cardiovascular: Normal rate, regular rhythm and normal heart sounds.  Exam reveals no gallop and no friction rub.   No murmur heard. Pulmonary/Chest: Effort normal and breath sounds normal. No respiratory distress.  Abdominal: Soft. Bowel sounds are normal. She exhibits no distension and no mass. There is tenderness (mild diffuse lower abdominal tenderness). There is no guarding.  Genitourinary: Vagina normal. Pelvic exam was performed with patient supine. There is no rash, tenderness, lesion or injury on the right labia. There is no rash, tenderness, lesion or injury on the left labia. No tenderness or bleeding in the vagina. No vaginal discharge found.  Pelvic exam: VULVA: normal appearing vulva with no masses, tenderness or lesions, VAGINA: normal appearing vagina with normal color and discharge, no lesions, CERVIX: normal appearing cervix without discharge or lesions, DNA  probe for chlamydia and GC obtained, cervical motion tenderness absent, UTERUS: uterus is normal size, shape, consistency and nontender, ADNEXA: normal adnexa in size, nontender and no masses, exam chaperoned,  Wet prep collected  Neurological: She is alert and oriented to person, place, and time.  Skin: Skin is warm and dry. She is not diaphoretic.  Nursing note and vitals reviewed.   ED Course  Procedures (including critical care time) Labs Review Labs Reviewed  WET PREP, GENITAL - Abnormal; Notable for the following:    WBC, Wet Prep HPF POC FEW (*)    All other components within normal limits  CBC WITH DIFFERENTIAL/PLATELET - Abnormal; Notable for the following:    WBC 12.7 (*)    Neutro Abs 7.8 (*)    All other components within normal limits  URINALYSIS, ROUTINE W REFLEX MICROSCOPIC - Abnormal; Notable for the following:    APPearance CLOUDY (*)    Leukocytes, UA TRACE (*)    All other components within normal limits  URINE MICROSCOPIC-ADD ON - Abnormal; Notable for the following:    Squamous Epithelial / LPF FEW (*)    All other components within normal limits  URINE CULTURE  COMPREHENSIVE METABOLIC PANEL  LIPASE, BLOOD  HIV ANTIBODY (ROUTINE TESTING)  RPR  POC URINE PREG, ED  POC URINE PREG, ED  GC/CHLAMYDIA PROBE AMP ([place])    Imaging Review No  results found.   EKG Interpretation None      MDM   Final diagnoses:  Pelvic pain in female  Dyspareunia, female   BP 117/70 mmHg  Pulse 93  Temp(Src) 98.6 F (37 C) (Oral)  Resp 16  SpO2 100%  LMP 07/07/2014  Patient with benign abdominal and pelvic exam. She is afebrile and and HDS. No indication of PID or infection on examination. Her labs do show a leukocytosis which may indicate cryptic infection or acute phase reaction from pain. Her labs are otherwise WNL. I There is a GC/Chlamydia probe pending. Urine culture pending. I do not feel her labs/ PE and rather nonspecific complaint warrant imaging at  this time.  Pain controlled here in the ED. I will refer the patient to her PCP or ob gyn for further op eval and have encouraged the patient to set up an appointment asap.  I have discussed the case with Dr. Lynelle Doctor who is in agreement with POC. I have discussed reasons to seek immediate care at the ED with patient as well. The patient appears reasonably screened and/or stabilized for discharge and I doubt any other medical condition or other [facility] requiring further screening, evaluation, or treatment in the ED at this time prior to discharge.    Arthor Captain, PA-C 07/16/14 1108  Linwood Dibbles, MD 07/16/14 [phone]

## 2014-07-17 LAB — URINE CULTURE

## 2014-07-17 LAB — HIV ANTIBODY (ROUTINE TESTING W REFLEX): HIV Screen 4th Generation wRfx: NONREACTIVE

## 2014-07-17 LAB — RPR: RPR: NONREACTIVE

## 2014-07-18 ENCOUNTER — Encounter: Payer: Self-pay | Admitting: *Deleted

## 2014-07-18 ENCOUNTER — Telehealth: Payer: Self-pay | Admitting: *Deleted

## 2014-07-18 ENCOUNTER — Encounter: Payer: Self-pay | Admitting: Obstetrics & Gynecology

## 2014-07-18 NOTE — Telephone Encounter (Signed)
Pamela Costa missed a scheduled appointment for follow up after [facility] pelvic pain visit. Called and unable to leave message. Will send letter.

## 2014-08-14 ENCOUNTER — Encounter: Payer: Self-pay | Admitting: Obstetrics & Gynecology

## 2014-08-14 ENCOUNTER — Other Ambulatory Visit (HOSPITAL_COMMUNITY)
Admission: RE | Admit: 2014-08-14 | Discharge: 2014-08-14 | Disposition: A | Discharge status: Home / Self Care | Payer: Medicaid Other | Source: Ambulatory Visit | Attending: Obstetrics & Gynecology | Admitting: Obstetrics & Gynecology

## 2014-08-14 ENCOUNTER — Ambulatory Visit (INDEPENDENT_AMBULATORY_CARE_PROVIDER_SITE_OTHER): Payer: Self-pay | Admitting: Obstetrics & Gynecology

## 2014-08-14 VITALS — BP 111/60 | HR 72 | Ht 68.0 in | Wt 184.3 lb

## 2014-08-14 DIAGNOSIS — Z124 Encounter for screening for malignant neoplasm of cervix: Secondary | ICD-10-CM

## 2014-08-14 DIAGNOSIS — R1030 Lower abdominal pain, unspecified: Secondary | ICD-10-CM

## 2014-08-14 DIAGNOSIS — Z309 Encounter for contraceptive management, unspecified: Secondary | ICD-10-CM

## 2014-08-14 DIAGNOSIS — N938 Other specified abnormal uterine and vaginal bleeding: Secondary | ICD-10-CM

## 2014-08-14 DIAGNOSIS — Z3009 Encounter for other general counseling and advice on contraception: Secondary | ICD-10-CM

## 2014-08-14 DIAGNOSIS — Z1151 Encounter for screening for human papillomavirus (HPV): Secondary | ICD-10-CM

## 2014-08-14 DIAGNOSIS — R109 Unspecified abdominal pain: Secondary | ICD-10-CM | POA: Insufficient documentation

## 2014-08-14 DIAGNOSIS — Z01419 Encounter for gynecological examination (general) (routine) without abnormal findings: Secondary | ICD-10-CM | POA: Insufficient documentation

## 2014-08-14 MED ORDER — NORGESTIMATE-ETH ESTRADIOL 0.25-35 MG-MCG PO TABS
1.0000 | ORAL_TABLET | Freq: Every day | ORAL | Status: DC
Start: 2014-08-14 — End: 2015-09-13

## 2014-08-14 MED ORDER — POLYETHYLENE GLYCOL 3350 17 G PO PACK: 17.0000 g | PACK | Freq: Every day | ORAL | Status: DC

## 2014-08-14 NOTE — Progress Notes (Signed)
Patient ID: Claretta FraiseMeshea T Bolyard, female   DOB: January 23, 1991, 24 y.o.   MRN: [id number]  Chief Complaint  Patient presents with  . Pelvic Pain    HPI Armiyah T Nelly RoutDawkins is a 24 y.o. female.  [id number] Patient's last menstrual period was 07/07/2014. DUB, extended menses for 1 month, lower abdominal pain and constipation, dyspareunia. [facility]  HPI  No past medical history on file.  Past Surgical History  Procedure Laterality Date  . Dilation and curettage of uterus    2011 Dr. Jolayne Pantheronstant  No family history on file.  Social History History  Substance Use Topics  . Smoking status: Current Every Day Smoker  . Smokeless tobacco: Not on file  . Alcohol Use: No    No Known Allergies  Current Outpatient Prescriptions  Medication Sig Dispense Refill  . HYDROcodone-acetaminophen (NORCO) 5-325 MG per tablet Take 1-2 tablets by mouth every 6 (six) hours as needed for moderate pain. (Patient not taking: Reported on 08/14/2014) 20 tablet 0  . ibuprofen (CHILD IBUPROFEN) 100 MG/5ML suspension Take 30 mLs (600 mg total) by mouth every 8 (eight) hours as needed for fever, mild pain or moderate pain. (Patient not taking: Reported on 07/16/2014) 240 mL 0  . naproxen (NAPROSYN) 500 MG tablet Take 1 tablet (500 mg total) by mouth 2 (two) times daily with a meal. (Patient not taking: Reported on 08/14/2014) 30 tablet 0  . norgestimate-ethinyl estradiol (ORTHO-CYCLEN,SPRINTEC,PREVIFEM) 0.25-35 MG-MCG tablet Take 1 tablet by mouth daily. 1 Package 11  . ondansetron (ZOFRAN) 4 MG tablet Take 1 tablet (4 mg total) by mouth every 8 (eight) hours as needed for nausea or vomiting. (Patient not taking: Reported on 08/14/2014) 10 tablet 0  . polyethylene glycol (MIRALAX / GLYCOLAX) packet Take 17 g by mouth daily. 14 each 2  . predniSONE 5 MG/5ML solution Take 60 mLs (60 mg total) by mouth daily with breakfast. For 3 days (Patient not taking: Reported on 07/16/2014) 180 mL 0   No current  facility-administered medications for this visit.    Review of Systems Review of Systems  Constitutional: Negative.   Gastrointestinal: Positive for abdominal pain and constipation (2/week BM). Negative for nausea and vomiting.  Genitourinary: Positive for vaginal bleeding, menstrual problem, pelvic pain and dyspareunia.    Blood pressure 111/60, pulse 72, height 5\' 8"  (1.727 m), weight 184 lb 4.8 oz (83.598 kg), last menstrual period 07/07/2014.  Physical Exam Physical Exam  Constitutional: She is oriented to person, place, and time. She appears well-developed. No distress.  Pulmonary/Chest: Effort normal. No respiratory distress.  Abdominal: Soft. She exhibits no mass. There is tenderness (very mild). There is no rebound and no guarding.  Genitourinary: Vagina normal and uterus normal.  Vaginal bleeding, minimal tenderness no mass  Neurological: She is alert and oriented to person, place, and time.  Skin: Skin is warm and dry.  Psychiatric: She has a normal mood and affect. Her behavior is normal.    Data Reviewed ED visit and op note  Assessment    DUB, abdominal pelvic pain and constipation     Plan    Sprintec, Miralax, RTC 4 weeks        ARNOLD,JAMES 08/14/2014, 4:34 PM

## 2014-08-15 LAB — CYTOLOGY - PAP

## 2014-08-19 ENCOUNTER — Telehealth: Payer: Self-pay | Admitting: General Practice

## 2014-08-19 DIAGNOSIS — A599 Trichomoniasis, unspecified: Secondary | ICD-10-CM

## 2014-08-19 MED ORDER — METRONIDAZOLE 500 MG PO TABS: 500.0000 mg | ORAL_TABLET | Freq: Two times a day (BID) | ORAL | Status: DC

## 2014-08-19 NOTE — Telephone Encounter (Signed)
-----   Message from Adam PhenixJames G Arnold, MD sent at 08/19/2014  9:40 AM EDT ----- Flagyl 500 mg BID 7 days trichomonas on pap

## 2014-08-19 NOTE — Telephone Encounter (Signed)
Med ordered. Called patient and informed her of results. Also discussed with patient that she will need to inform her partner so they can be treated as well and that she will need to abstain for 2 weeks following treatment. Patient verbalized understanding and asked if she could take one pill and give the other pill to her boyfriend. Told patient no that she will need to take both pills daily for a week. Told patient that her partner can go to the HD or his regular doctors office to get treatment. Patient verbalized understanding and had no other questions

## 2015-01-25 ENCOUNTER — Encounter (HOSPITAL_COMMUNITY): Payer: Self-pay

## 2015-01-25 ENCOUNTER — Emergency Department (HOSPITAL_COMMUNITY)
Admission: EM | Admit: 2015-01-25 | Discharge: 2015-01-25 | Disposition: A | Discharge status: Home / Self Care | Payer: No Typology Code available for payment source | Attending: Emergency Medicine | Admitting: Emergency Medicine

## 2015-01-25 DIAGNOSIS — Y92410 Unspecified street and highway as the place of occurrence of the external cause: Secondary | ICD-10-CM | POA: Diagnosis not present

## 2015-01-25 DIAGNOSIS — Z720 Tobacco use: Secondary | ICD-10-CM | POA: Insufficient documentation

## 2015-01-25 DIAGNOSIS — Y998 Other external cause status: Secondary | ICD-10-CM | POA: Insufficient documentation

## 2015-01-25 DIAGNOSIS — Z3202 Encounter for pregnancy test, result negative: Secondary | ICD-10-CM | POA: Diagnosis not present

## 2015-01-25 DIAGNOSIS — Z792 Long term (current) use of antibiotics: Secondary | ICD-10-CM | POA: Diagnosis not present

## 2015-01-25 DIAGNOSIS — Z79818 Long term (current) use of other agents affecting estrogen receptors and estrogen levels: Secondary | ICD-10-CM | POA: Diagnosis not present

## 2015-01-25 DIAGNOSIS — Y9389 Activity, other specified: Secondary | ICD-10-CM | POA: Diagnosis not present

## 2015-01-25 DIAGNOSIS — Z79899 Other long term (current) drug therapy: Secondary | ICD-10-CM | POA: Insufficient documentation

## 2015-01-25 DIAGNOSIS — S39012A Strain of muscle, fascia and tendon of lower back, initial encounter: Secondary | ICD-10-CM | POA: Diagnosis not present

## 2015-01-25 DIAGNOSIS — S0990XA Unspecified injury of head, initial encounter: Secondary | ICD-10-CM | POA: Diagnosis not present

## 2015-01-25 DIAGNOSIS — S3992XA Unspecified injury of lower back, initial encounter: Secondary | ICD-10-CM | POA: Diagnosis present

## 2015-01-25 LAB — I-STAT BETA HCG BLOOD, ED (MC, WL, AP ONLY)

## 2015-01-25 MED ORDER — CYCLOBENZAPRINE HCL 5 MG PO TABS: 5.0000 mg | ORAL_TABLET | Freq: Three times a day (TID) | ORAL | Status: DC

## 2015-01-25 MED ORDER — IBUPROFEN 200 MG PO TABS
600.0000 mg | ORAL_TABLET | Freq: Once | ORAL | Status: AC
  Administered 2015-01-25: 600 mg via ORAL
  Filled 2015-01-25: qty 3

## 2015-01-25 MED ORDER — IBUPROFEN 600 MG PO TABS: 600.0000 mg | ORAL_TABLET | Freq: Three times a day (TID) | ORAL | Status: DC

## 2015-01-25 MED ORDER — CYCLOBENZAPRINE HCL 10 MG PO TABS
5.0000 mg | ORAL_TABLET | Freq: Once | ORAL | Status: AC
  Administered 2015-01-25: 5 mg via ORAL
  Filled 2015-01-25: qty 1
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
## 2015-09-12 ENCOUNTER — Inpatient Hospital Stay (HOSPITAL_COMMUNITY)
Admission: AD | Admit: 2015-09-12 | Discharge: 2015-09-13 | Disposition: A | Discharge status: Home / Self Care | Payer: No Typology Code available for payment source | Source: Ambulatory Visit | Attending: Obstetrics & Gynecology | Admitting: Obstetrics & Gynecology

## 2015-09-12 DIAGNOSIS — F17210 Nicotine dependence, cigarettes, uncomplicated: Secondary | ICD-10-CM | POA: Insufficient documentation

## 2015-09-12 DIAGNOSIS — N941 Unspecified dyspareunia: Secondary | ICD-10-CM

## 2015-09-12 DIAGNOSIS — B9689 Other specified bacterial agents as the cause of diseases classified elsewhere: Secondary | ICD-10-CM

## 2015-09-12 DIAGNOSIS — Z79899 Other long term (current) drug therapy: Secondary | ICD-10-CM | POA: Insufficient documentation

## 2015-09-12 DIAGNOSIS — N760 Acute vaginitis: Secondary | ICD-10-CM | POA: Insufficient documentation

## 2015-09-12 DIAGNOSIS — N930 Postcoital and contact bleeding: Secondary | ICD-10-CM

## 2015-09-12 HISTORY — DX: Other specified health status: Z78.9

## 2015-09-12 LAB — POCT PREGNANCY, URINE: Preg Test, Ur: NEGATIVE

## 2015-09-12 LAB — URINALYSIS, ROUTINE W REFLEX MICROSCOPIC
Bilirubin Urine: NEGATIVE
GLUCOSE, UA: NEGATIVE mg/dL
Hgb urine dipstick: NEGATIVE
Ketones, ur: NEGATIVE mg/dL
Leukocytes, UA: NEGATIVE
Nitrite: NEGATIVE
PH: 7.5 (ref 5.0–8.0)
Protein, ur: NEGATIVE mg/dL
Specific Gravity, Urine: 1.02 (ref 1.005–1.030)

## 2015-09-12 NOTE — MAU Note (Addendum)
Vaginal and abdominal pains for 2 wks. Sharp pain that comes and goes. Having 2 periods each month since January. LMP 3/25. Denies vag bleeding or d/c now. Trying to get pregnant. Intercourse is painful. States does have irreg periods where sometimes skips periods and sometimes has 2 periods a month

## 2015-09-13 ENCOUNTER — Encounter (HOSPITAL_COMMUNITY): Payer: Self-pay | Admitting: *Deleted

## 2015-09-13 LAB — WET PREP, GENITAL
SPERM: NONE SEEN
TRICH WET PREP: NONE SEEN
Yeast Wet Prep HPF POC: NONE SEEN

## 2015-09-13 LAB — RPR: RPR Ser Ql: NONREACTIVE

## 2015-09-13 LAB — CBC
HCT: 36.5 % (ref 36.0–46.0)
Hemoglobin: 12 g/dL (ref 12.0–15.0)
MCH: 29.1 pg (ref 26.0–34.0)
MCHC: 32.9 g/dL (ref 30.0–36.0)
MCV: 88.4 fL (ref 78.0–100.0)
PLATELETS: 276 10*3/uL (ref 150–400)
RBC: 4.13 MIL/uL (ref 3.87–5.11)
RDW: 12.5 % (ref 11.5–15.5)
WBC: 7.6 10*3/uL (ref 4.0–10.5)

## 2015-09-13 LAB — HIV ANTIBODY (ROUTINE TESTING W REFLEX): HIV SCREEN 4TH GENERATION: NONREACTIVE

## 2015-09-13 MED ORDER — METRONIDAZOLE 500 MG PO TABS: 500.0000 mg | ORAL_TABLET | Freq: Two times a day (BID) | ORAL | Status: DC

## 2015-09-13 NOTE — Discharge Instructions (Signed)
I encourage you to establish with a primary care doctor.  Possible practices that may be accepting patients are: [facility], [facility] or [facility].  You were found to have bacterial vaginosis.  This is NOT an STD but it warrants treatment.  You are being discharged with a 7 day supply of antibiotics.  Take these twice daily.  DO NOT DRINK ALCOHOL while taking this medication.   Abnormal Uterine Bleeding Abnormal uterine bleeding means bleeding from the vagina that is not your normal menstrual period. This can be:  Bleeding or spotting between periods.  Bleeding after sex (sexual intercourse).  Bleeding that is heavier or more than normal.  Periods that last longer than usual.  Bleeding after menopause. There are many problems that may cause this. Treatment will depend on the cause of the bleeding. Any kind of bleeding that is not normal should be reviewed by your doctor.  HOME CARE Watch your condition for any changes. These actions may lessen any discomfort you are having:  Do not use tampons or douches as told by your doctor.  Change your pads often. You should get regular pelvic exams and Pap tests. Keep all appointments for tests as told by your doctor. GET HELP IF:  You are bleeding for more than 1 week.  You feel dizzy at times. GET HELP RIGHT AWAY IF:   You pass out.  You have to change pads every 15 to 30 minutes.  You have belly pain.  You have a fever.  You become sweaty or weak.  You are passing large blood clots from the vagina.  You feel sick to your stomach (nauseous) and throw up (vomit). MAKE SURE YOU:  Understand these instructions.  Will watch your condition.  Will get help right away if you are not doing well or get worse.   This information is not intended to replace advice given to you by your health care provider. Make sure you discuss any questions you have with your health care  provider.   Document Released: 03/20/2009 Document Revised: 05/28/2013 Document Reviewed: 12/20/2012 Elsevier Interactive Patient Education [employer]

## 2015-09-13 NOTE — Progress Notes (Signed)
Dr Nadine CountsGottschalk in earlier to discuss test results and d/c plan. Written and verbal d/c instructions given and understanding voiced

## 2015-09-13 NOTE — MAU Provider Note (Signed)
History     CSN: [id number]  Arrival date and time: 09/12/15 2214   First Provider Initiated Contact with Patient 09/13/15 0035      No chief complaint on file.  HPI  Shalena T Bushee is a 25 y.o. female [id number] that presents to MAU with complaints of dyspareunia and post coital bleeding.  She notes that this has been going on for about 2 weeks.  She denies vaginal pruritis, discharge or odor.  Reports that she is actively trying to get pregnant and is not using contraceptives.  She has intercourse with 1 female partner of 10 years.  She has been trying to get pregnant for >1 year.  She notes that there is a possibility of infidelity.  She reports nausea without vomiting.  No fevers, abdominal pain, diarrhea.  She would like to be tested for STIs today. Patient's last menstrual period was 08/29/2015.  Pertinent Gynecological History: Menses: sometimes skips cycles, sometimes has multiple cycles within 1 month Bleeding: dysfunctional uterine bleeding Contraception: none DES exposure: unknown Blood transfusions: none Sexually transmitted diseases: currently at risk   Past Medical History  Diagnosis Date  . Medical history non-contributory     Past Surgical History  Procedure Laterality Date  . Dilation and curettage of uterus      Family History  Problem Relation Age of Onset  . Hypertension Mother   . Diabetes Mother     Social History  Substance Use Topics  . Smoking status: Current Every Day Smoker    Types: Cigarettes  . Smokeless tobacco: None  . Alcohol Use: No    Allergies: No Known Allergies  Prescriptions prior to admission  Medication Sig Dispense Refill Last Dose  . cyclobenzaprine (FLEXERIL) 5 MG tablet Take 1 tablet (5 mg total) by mouth 3 (three) times daily. 20 tablet 0   . HYDROcodone-acetaminophen (NORCO) 5-325 MG per tablet Take 1-2 tablets by mouth every 6 (six) hours as needed for moderate pain. (Patient not taking: Reported on 08/14/2014) 20 tablet  0 Not Taking  . ibuprofen (ADVIL,MOTRIN) 600 MG tablet Take 1 tablet (600 mg total) by mouth 3 (three) times daily. 20 tablet 0   . metroNIDAZOLE (FLAGYL) 500 MG tablet Take 1 tablet (500 mg total) by mouth 2 (two) times daily. (Patient not taking: Reported on 01/25/2015) 14 tablet 0   . naproxen (NAPROSYN) 500 MG tablet Take 1 tablet (500 mg total) by mouth 2 (two) times daily with a meal. (Patient not taking: Reported on 08/14/2014) 30 tablet 0 Not Taking  . norgestimate-ethinyl estradiol (ORTHO-CYCLEN,SPRINTEC,PREVIFEM) 0.25-35 MG-MCG tablet Take 1 tablet by mouth daily. (Patient not taking: Reported on 01/25/2015) 1 Package 11   . ondansetron (ZOFRAN) 4 MG tablet Take 1 tablet (4 mg total) by mouth every 8 (eight) hours as needed for nausea or vomiting. (Patient not taking: Reported on 08/14/2014) 10 tablet 0 Not Taking  . polyethylene glycol (MIRALAX / GLYCOLAX) packet Take 17 g by mouth daily. (Patient not taking: Reported on 01/25/2015) 14 each 2   . predniSONE 5 MG/5ML solution Take 60 mLs (60 mg total) by mouth daily with breakfast. For 3 days (Patient not taking: Reported on 07/16/2014) 180 mL 0 Not Taking    Review of Systems  Constitutional: Negative for fever.  Gastrointestinal: Positive for nausea. Negative for vomiting, abdominal pain and diarrhea.  Genitourinary: Positive for hematuria. Negative for dysuria.   Physical Exam   Blood pressure 111/71, pulse 85, temperature 97.5 F (36.4 C), resp. rate 18, height  5\' 7"  (1.702 m), weight 193 lb 12.8 oz (87.907 kg), last menstrual period 08/29/2015.  Physical Exam  Constitutional: She appears well-developed and well-nourished. No distress.  HENT:  Head: Normocephalic.  Eyes: EOM are normal. No scleral icterus.  Neck: Normal range of motion.  Cardiovascular: Normal rate.   Respiratory: Effort normal.  GI: Soft. She exhibits no distension and no mass. There is no tenderness. There is no rebound and no guarding.  Genitourinary: Vagina  normal. Cervix exhibits motion tenderness (mild). Cervix exhibits no discharge and no friability. Right adnexum displays no mass and no tenderness. Left adnexum displays no mass and no tenderness. No bleeding in the vagina. No vaginal discharge found.  No punctate lesions appreciated at cervix.  +fishy odor  Musculoskeletal: Normal range of motion.  Skin: She is not diaphoretic.  Psychiatric: She has a normal mood and affect. Her behavior is normal.   Results for orders placed or performed during the hospital encounter of 09/12/15 (from the past 24 hour(s))  Urinalysis, Routine w reflex microscopic (not at [facility])     Status: None   Collection Time: 09/12/15 10:40 PM  Result Value Ref Range   Color, Urine YELLOW YELLOW   APPearance CLEAR CLEAR   Specific Gravity, Urine 1.020 1.005 - 1.030   pH 7.5 5.0 - 8.0   Glucose, UA NEGATIVE NEGATIVE mg/dL   Hgb urine dipstick NEGATIVE NEGATIVE   Bilirubin Urine NEGATIVE NEGATIVE   Ketones, ur NEGATIVE NEGATIVE mg/dL   Protein, ur NEGATIVE NEGATIVE mg/dL   Nitrite NEGATIVE NEGATIVE   Leukocytes, UA NEGATIVE NEGATIVE  Pregnancy, urine POC     Status: None   Collection Time: 09/12/15 10:49 PM  Result Value Ref Range   Preg Test, Ur NEGATIVE NEGATIVE  Wet prep, genital     Status: Abnormal   Collection Time: 09/13/15 12:40 AM  Result Value Ref Range   Yeast Wet Prep HPF POC NONE SEEN NONE SEEN   Trich, Wet Prep NONE SEEN NONE SEEN   Clue Cells Wet Prep HPF POC PRESENT (A) NONE SEEN   WBC, Wet Prep HPF POC FEW (A) NONE SEEN   Sperm NONE SEEN     MAU Course  Procedures  MDM 0040: UA negative for infection/blood, Upreg negative. Wet prep, GC/CT, HIV, RPR, CBC ordered.  Discussed that fertility and dysfunctional uterine bleeding would need to be worked up by a PCP. 0140: Wet prep suggestive of BV.  CBC pending  Assessment and Plan   Dyspareunia in female: UA negative for infection, UPreg negative, Wet prep with evidence of BV, CBC  unremarkable.  VERY low suspicion for PID.  More likely pain 2/2 BV. - GC/CT sent and pending at time of discharge - HIV, RPR sent - Recommended incorporation of lubrication during intercourse - Recommended establishing with a PCP for continued management - Return precautions reviewed.  PCB (post coital bleeding) - as above  Bacterial vaginosis - Flagyl 500mg  BID x7 days - Avoid ETOH  Patient discharged in stable condition home.  Delynn FlavinAshly Brealynn Contino, DO 09/13/2015, 12:47 AM

## 2015-09-14 LAB — GC/CHLAMYDIA PROBE AMP (~~LOC~~) NOT AT ARMC
CHLAMYDIA, DNA PROBE: NEGATIVE
Neisseria Gonorrhea: NEGATIVE

## 2016-06-09 ENCOUNTER — Encounter (HOSPITAL_COMMUNITY): Payer: Self-pay | Admitting: *Deleted

## 2016-06-09 ENCOUNTER — Emergency Department (HOSPITAL_COMMUNITY): Payer: Self-pay

## 2016-06-09 ENCOUNTER — Emergency Department (HOSPITAL_COMMUNITY)
Admission: EM | Admit: 2016-06-09 | Discharge: 2016-06-10 | Disposition: A | Discharge status: Home / Self Care | Payer: Self-pay | Attending: Emergency Medicine | Admitting: Emergency Medicine

## 2016-06-09 DIAGNOSIS — M25551 Pain in right hip: Secondary | ICD-10-CM | POA: Insufficient documentation

## 2016-06-09 DIAGNOSIS — K0889 Other specified disorders of teeth and supporting structures: Secondary | ICD-10-CM | POA: Insufficient documentation

## 2016-06-09 DIAGNOSIS — M25511 Pain in right shoulder: Secondary | ICD-10-CM | POA: Insufficient documentation

## 2016-06-09 DIAGNOSIS — F17210 Nicotine dependence, cigarettes, uncomplicated: Secondary | ICD-10-CM | POA: Insufficient documentation

## 2016-06-09 DIAGNOSIS — Z79899 Other long term (current) drug therapy: Secondary | ICD-10-CM | POA: Insufficient documentation

## 2016-06-09 LAB — POC URINE PREG, ED: Preg Test, Ur: NEGATIVE

## 2016-06-09 MED ORDER — PENICILLIN V POTASSIUM 500 MG PO TABS
500.0000 mg | ORAL_TABLET | Freq: Once | ORAL | Status: AC
  Administered 2016-06-09: 500 mg via ORAL
  Filled 2016-06-09: qty 1

## 2016-06-09 MED ORDER — IBUPROFEN 800 MG PO TABS
800.0000 mg | ORAL_TABLET | Freq: Once | ORAL | Status: AC
  Administered 2016-06-09: 800 mg via ORAL
  Filled 2016-06-09: qty 1

## 2016-06-09 NOTE — ED Triage Notes (Signed)
Pt complains of right lower wisdom tooth pain for the past 2 days. Pt states the pain has spread from her wisdom tooth to behind her eyes. Pt states her right arm and right hip began hurting today.

## 2016-06-10 MED ORDER — IBUPROFEN 800 MG PO TABS: 800.0000 mg | ORAL_TABLET | Freq: Three times a day (TID) | ORAL | 0 refills | Status: DC

## 2016-06-10 MED ORDER — PENICILLIN V POTASSIUM 500 MG PO TABS: 500.0000 mg | ORAL_TABLET | Freq: Four times a day (QID) | ORAL | 0 refills | Status: AC

## 2016-06-10 NOTE — Discharge Instructions (Signed)
Medications: Penicillin, ibuprofen  Treatment: Take penicillin 4 times daily for 7 days. Make sure to finish all of this medication. Take ibuprofen every 8 hours as needed for your pain. Use a heating pad on your sore muscles and 20 minutes on, 20 minutes off. Attempted stretches 1-2 times a day as we discussed.  Follow-up: Please call Dr. Lucky CowboyKnox, a dentist, tomorrow for further evaluation and treatment of your dental pain. If you're unable to see Dr. Lucky CowboyKnox, you can also call if any of the resources attached. Please return to the emergency department if you develop any new or worsening symptoms.

## 2016-06-10 NOTE — ED Notes (Signed)
Pt declined d/c vitals. She stated that her ride was waiting outside.

## 2016-06-10 NOTE — ED Provider Notes (Signed)
[facility]-EMERGENCY DEPT Provider Note   CSN: [id number] Arrival date & time: 06/09/16  2208     History   Chief Complaint Chief Complaint  Patient presents with  . Dental Pain  . Arm Pain  . Hip Pain    HPI Pamela Costa is a 26 y.o. female who is previously healthy who presents with a 2 day history of right lower, posterior dental pain. Patient also reports waking up with right shoulder pain and right hip pain. Patient port being in bed over the past 2 days due to her pain. She reports being curled up into the fetal position. Patient has taken Goody powders without relief. Patient reports a fever at home 2 days ago. Patient reports she vomited one time right after taking the powders, but no other nausea or vomiting. Patient denies any chest pain, shortness of breath, abdominal pain.  HPI  Past Medical History:  Diagnosis Date  . Medical history non-contributory     Patient Active Problem List   Diagnosis Date Noted  . Abdominal pain 08/14/2014  . DUB (dysfunctional uterine bleeding) 08/14/2014  . Encounter for general counseling on prescription of oral contraceptives 08/14/2014    Past Surgical History:  Procedure Laterality Date  . DILATION AND CURETTAGE OF UTERUS      OB History    Gravida Para Term Preterm AB Living   0 0 0 0 0 0   SAB TAB Ectopic Multiple Live Births   0 0 0 0         Home Medications    Prior to Admission medications   Medication Sig Start Date End Date Taking? Authorizing Provider  ibuprofen (ADVIL,MOTRIN) 800 MG tablet Take 1 tablet (800 mg total) by mouth 3 (three) times daily. 06/10/16   Emi Holes, PA-C  metroNIDAZOLE (FLAGYL) 500 MG tablet Take 1 tablet (500 mg total) by mouth 2 (two) times daily. 09/13/15   Ashly Hulen Skains, DO  penicillin v potassium (VEETID) 500 MG tablet Take 1 tablet (500 mg total) by mouth 4 (four) times daily. 06/10/16 06/17/16  Emi Holes, PA-C    Family History Family History  Problem Relation Age of  Onset  . Hypertension Mother   . Diabetes Mother     Social History Social History  Substance Use Topics  . Smoking status: Current Every Day Smoker    Types: Cigarettes  . Smokeless tobacco: Never Used  . Alcohol use No     Allergies   Patient has no known allergies.   Review of Systems Review of Systems  Constitutional: Negative for chills and fever.  HENT: Positive for dental problem. Negative for facial swelling and sore throat.   Respiratory: Negative for shortness of breath.   Cardiovascular: Negative for chest pain.  Gastrointestinal: Negative for abdominal pain, nausea and vomiting.  Genitourinary: Negative for dysuria.  Musculoskeletal: Positive for arthralgias and myalgias. Negative for back pain.  Skin: Negative for rash and wound.  Neurological: Negative for headaches.  Psychiatric/Behavioral: The patient is not nervous/anxious.      Physical Exam Updated Vital Signs BP 132/75 (BP Location: Left Arm)   Pulse 80   Temp 99.4 F (37.4 C) (Oral)   Resp 14   Ht 5\' 7"  (1.702 m)   Wt 90.3 kg   LMP 05/09/2016 Comment: neg preg test 06-09-2016  SpO2 98%   BMI 31.18 kg/m   Physical Exam  Constitutional: She appears well-developed and well-nourished. No distress.  HENT:  Head: Normocephalic and atraumatic.  Mouth/Throat: Oropharynx is clear and moist and mucous membranes are normal. No trismus in the jaw. Abnormal dentition. No dental abscesses or uvula swelling. No oropharyngeal exudate, posterior oropharyngeal edema, posterior oropharyngeal erythema or tonsillar abscesses.    Eyes: Conjunctivae are normal. Pupils are equal, round, and reactive to light. Right eye exhibits no discharge. Left eye exhibits no discharge. No scleral icterus.  Neck: Normal range of motion. Neck supple. Muscular tenderness present. No spinous process tenderness present. Normal range of motion present. No thyromegaly present.    Cardiovascular: Normal rate, regular rhythm, normal  heart sounds and intact distal pulses.  Exam reveals no gallop and no friction rub.   No murmur heard. Pulmonary/Chest: Effort normal and breath sounds normal. No stridor. No respiratory distress. She has no wheezes. She has no rales.  Abdominal: Soft. Bowel sounds are normal. She exhibits no distension. There is no tenderness. There is no rebound and no guarding.  Musculoskeletal: She exhibits no edema.       Legs: Patient with right upper trapezius tenderness, no right shoulder bony tenderness; right anterior hip tenderness including quadricep tenderness  Lymphadenopathy:    She has no cervical adenopathy.  Neurological: She is alert. Coordination normal.  Skin: Skin is warm and dry. No rash noted. She is not diaphoretic. No pallor.  Psychiatric: She has a normal mood and affect.  Nursing note and vitals reviewed.    ED Treatments / Results  Labs (all labs ordered are listed, but only abnormal results are displayed) Labs Reviewed  POC URINE PREG, ED    EKG  EKG Interpretation None       Radiology Dg Hip Unilat W Or Wo Pelvis 2-3 Views Right  Result Date: 06/09/2016 CLINICAL DATA:  Nontraumatic right hip pain for 2 days. EXAM: DG HIP (WITH OR WITHOUT PELVIS) 2-3V RIGHT COMPARISON:  None. FINDINGS: There is no evidence of hip fracture or dislocation. There is no evidence of arthropathy or other focal bone abnormality. IMPRESSION: Negative. Electronically Signed   By: Ellery Plunk M.D.   On: 06/09/2016 23:30    Procedures Procedures (including critical care time)  Medications Ordered in ED Medications  ibuprofen (ADVIL,MOTRIN) tablet 800 mg (800 mg Oral Given 06/09/16 2305)  penicillin v potassium (VEETID) tablet 500 mg (500 mg Oral Given 06/09/16 2305)     Initial Impression / Assessment and Plan / ED Course  I have reviewed the triage vital signs and the nursing notes.  Pertinent labs & imaging results that were available during my care of the patient were reviewed  by me and considered in my medical decision making (see chart for details).  Clinical Course     Patient with dentalgia, as well as right shoulder and hip pain.  No abscess requiring immediate incision and drainage.  Exam not concerning for Ludwig's angina or pharyngeal abscess. X-ray of right hip negative. I suspect patient's muscle pain and soreness is due to tension and most recent positioning due to dental pain.  Will treat with penicillin and ibuprofen. Pt instructed to follow-up with dentist as soon as possible.  Discussed return precautions. Patient understands and agrees with plan. Patient vitals stable throughout ED course and discharged in satisfactory condition. I discussed patient case with Dr. Ethelda Chick who guided the patient's management and agrees with plan.   Final Clinical Impressions(s) / ED Diagnoses   Final diagnoses:  Right hip pain  Acute pain of right shoulder  Pain, dental    New Prescriptions Discharge Medication List as of  06/10/2016 12:18 AM    START taking these medications   Details  ibuprofen (ADVIL,MOTRIN) 800 MG tablet Take 1 tablet (800 mg total) by mouth 3 (three) times daily., Starting Fri 06/10/2016, Print    penicillin v potassium (VEETID) 500 MG tablet Take 1 tablet (500 mg total) by mouth 4 (four) times daily., Starting Fri 06/10/2016, Until Fri 06/17/2016, Print         Emi HolesAlexandra M Elissia Spiewak, PA-C 06/10/16 0040    Doug SouSam Jacubowitz, MD 06/10/16 [id number]

## 2019-01-15 ENCOUNTER — Inpatient Hospital Stay (HOSPITAL_COMMUNITY)
Admission: AD | Admit: 2019-01-15 | Discharge: 2019-01-15 | Disposition: A | Discharge status: Home / Self Care | Payer: Self-pay | Attending: Family Medicine | Admitting: Family Medicine

## 2019-01-15 ENCOUNTER — Other Ambulatory Visit: Payer: Self-pay

## 2019-01-15 DIAGNOSIS — N939 Abnormal uterine and vaginal bleeding, unspecified: Secondary | ICD-10-CM | POA: Insufficient documentation

## 2019-01-15 DIAGNOSIS — Z3009 Encounter for other general counseling and advice on contraception: Secondary | ICD-10-CM

## 2019-01-15 DIAGNOSIS — N938 Other specified abnormal uterine and vaginal bleeding: Secondary | ICD-10-CM

## 2019-01-15 LAB — POCT PREGNANCY, URINE: Preg Test, Ur: NEGATIVE

## 2019-01-15 NOTE — MAU Note (Addendum)
Pt here for vaginal bleeding and pelvic pain that started yesterday. Reports she has been passing clots. Not pregnant.

## 2019-01-15 NOTE — Discharge Instructions (Signed)
Menorrhagia ° °Menorrhagia is a condition in which menstrual periods are heavy or last longer than normal. With menorrhagia, most periods a woman has may cause enough blood loss and cramping that she becomes unable to take part in her usual activities. °What are the causes? °Common causes of this condition include: °· Noncancerous growths in the uterus (polyps or fibroids). °· An imbalance of the estrogen and progesterone hormones. °· One of the ovaries not releasing an egg during one or more months. °· A problem with the thyroid gland (hypothyroid). °· Side effects of having an intrauterine device (IUD). °· Side effects of some medicines, such as anti-inflammatory medicines or blood thinners. °· A bleeding disorder that stops the blood from clotting normally. °In some cases, the cause of this condition is not known. °What are the signs or symptoms? °Symptoms of this condition include: °· Routinely having to change your pad or tampon every 1-2 hours because it is completely soaked. °· Needing to use pads and tampons at the same time because of heavy bleeding. °· Needing to wake up to change your pads or tampons during the night. °· Passing blood clots larger than 1 inch (2.5 cm) in size. °· Having bleeding that lasts for more than 7 days. °· Having symptoms of low iron levels (anemia), such as tiredness, fatigue, or shortness of breath. °How is this diagnosed? °This condition may be diagnosed based on: °· A physical exam. °· Your symptoms and menstrual history. °· Tests, such as: °? Blood tests to check if you are pregnant or have hormonal changes, a bleeding or thyroid disorder, anemia, or other problems. °? Pap test to check for cancerous changes, infections, or inflammation. °? Endometrial biopsy. This test involves removing a tissue sample from the lining of the uterus (endometrium) to be examined under a microscope. °? Pelvic ultrasound. This test uses sound waves to create images of your uterus, ovaries, and  vagina. The images can show if you have fibroids or other growths. °? Hysteroscopy. For this test, a small telescope is used to look inside your uterus. °How is this treated? °Treatment may not be needed for this condition. If it is needed, the best treatment for you will depend on: °· Whether you need to prevent pregnancy. °· Your desire to have children in the future. °· The cause and severity of your bleeding. °· Your personal preference. °Medicines are the first step in treatment. You may be treated with: °· Hormonal birth control methods. These treatments reduce bleeding during your menstrual period. They include: °? Birth control pills. °? Skin patch. °? Vaginal ring. °? Shots (injections) that you get every 3 months. °? Hormonal IUD (intrauterine device). °? Implants that go under the skin. °· Medicines that thicken blood and slow bleeding. °· Medicines that reduce swelling, such as ibuprofen. °· Medicines that contain an artificial (synthetic) hormone called progestin. °· Medicines that make the ovaries stop working for a short time. °· Iron supplements to treat anemia. °If medicines do not work, surgery may be done. Surgical options may include: °· Dilation and curettage (D&C). In this procedure, your health care provider opens (dilates) your cervix and then scrapes or suctions tissue from the endometrium to reduce menstrual bleeding. °· Operative hysteroscopy. In this procedure, a small tube with a light on the end (hysteroscope) is used to view your uterus and help remove polyps that may be causing heavy periods. °· Endometrial ablation. This is when various techniques are used to permanently destroy your entire endometrium.   After endometrial ablation, most women have little or no menstrual flow. This procedure reduces your ability to become pregnant.  Endometrial resection. In this procedure, an electrosurgical wire loop is used to remove the endometrium. This procedure reduces your ability to become  pregnant.  Hysterectomy. This is surgical removal of the uterus. This is a permanent procedure that stops menstrual periods. Pregnancy is not possible after a hysterectomy. Follow these instructions at home: Medicines  Take over-the-counter and prescription medicines exactly as told by your health care provider. This includes iron pills.  Do not change or switch medicines without asking your health care provider.  Do not take aspirin or medicines that contain aspirin 1 week before or during your menstrual period. Aspirin may make bleeding worse. General instructions  If you need to change your sanitary pad or tampon more than once every 2 hours, limit your activity until the bleeding stops.  Iron pills can cause constipation. To prevent or treat constipation while you are taking prescription iron supplements, your health care provider may recommend that you: ? Drink enough fluid to keep your urine clear or pale yellow. ? Take over-the-counter or prescription medicines. ? Eat foods that are high in fiber, such as fresh fruits and vegetables, whole grains, and beans. ? Limit foods that are high in fat and processed sugars, such as fried and sweet foods.  Eat well-balanced meals, including foods that are high in iron. Foods that have a lot of iron include leafy green vegetables, meat, liver, eggs, and whole grain breads and cereals.  Do not try to lose weight until the abnormal bleeding has stopped and your blood iron level is back to normal. If you need to lose weight, work with your health care provider to lose weight safely.  Keep all follow-up visits as told by your health care provider. This is important. Contact a health care provider if:  You soak through a pad or tampon every 1 or 2 hours, and this happens every time you have a period.  You need to use pads and tampons at the same time because you are bleeding so much.  You have nausea, vomiting, diarrhea, or other problems  related to medicines you are taking. Get help right away if:  You soak through more than a pad or tampon in 1 hour.  You pass clots bigger than 1 inch (2.5 cm) wide.  You feel short of breath.  You feel like your heart is beating too fast.  You feel dizzy or faint.  You feel very weak or tired. Summary  Menorrhagia is a condition in which menstrual periods are heavy or last longer than normal.  Treatment will depend on the cause of the condition and may include medicines or procedures.  Take over-the-counter and prescription medicines exactly as told by your health care provider. This includes iron pills.  Get help right away if you have heavy bleeding that soaks through more than a pad or tampon in 1 hour, you are passing large clots, or you feel dizzy, faint or short of breath. This information is not intended to replace advice given to you by your health care provider. Make sure you discuss any questions you have with your health care provider. Document Released: 05/23/2005 Document Revised: 08/30/2017 Document Reviewed: 05/16/2016 Elsevier Patient Education  2020 [employer]  [facility]    [facility]       Phone: [phone]  [facility]  Phone: [phone]  [facility] [place] [facility] [place]  Phone: [place] [facility] [place] [facility]  Phone: [place] [facility] [place] [facility] [place]  Phone: [place] for [place] [facility]   Phone: [facility]       Phone: [phone]  [facility]    Phone: [phone]   [facility]    Phone: [phone]  [facility]    Phone: [place]    Phone: [phone]  [place] [facility]       Phone:  [phone]   [place] [facility]  Phone: [place]    Phone: [phone]  [facility]   Phone: [phone]  [facility]      Phone: [phone]  [facility]    Phone: [phone]

## 2019-01-16 ENCOUNTER — Other Ambulatory Visit: Payer: Self-pay

## 2019-01-16 DIAGNOSIS — Z79899 Other long term (current) drug therapy: Secondary | ICD-10-CM | POA: Insufficient documentation

## 2019-01-16 DIAGNOSIS — F17210 Nicotine dependence, cigarettes, uncomplicated: Secondary | ICD-10-CM | POA: Insufficient documentation

## 2019-01-16 DIAGNOSIS — N760 Acute vaginitis: Secondary | ICD-10-CM | POA: Insufficient documentation

## 2019-01-16 DIAGNOSIS — N938 Other specified abnormal uterine and vaginal bleeding: Secondary | ICD-10-CM | POA: Insufficient documentation

## 2019-01-16 DIAGNOSIS — B9689 Other specified bacterial agents as the cause of diseases classified elsewhere: Secondary | ICD-10-CM | POA: Insufficient documentation

## 2019-01-17 ENCOUNTER — Emergency Department (HOSPITAL_COMMUNITY)
Admission: EM | Admit: 2019-01-17 | Discharge: 2019-01-17 | Disposition: A | Discharge status: Home / Self Care | Payer: Self-pay | Attending: Emergency Medicine | Admitting: Emergency Medicine

## 2019-01-17 ENCOUNTER — Encounter (HOSPITAL_COMMUNITY): Payer: Self-pay | Admitting: Emergency Medicine

## 2019-01-17 ENCOUNTER — Emergency Department (HOSPITAL_COMMUNITY): Payer: Self-pay

## 2019-01-17 ENCOUNTER — Other Ambulatory Visit: Payer: Self-pay

## 2019-01-17 DIAGNOSIS — N938 Other specified abnormal uterine and vaginal bleeding: Secondary | ICD-10-CM

## 2019-01-17 DIAGNOSIS — B9689 Other specified bacterial agents as the cause of diseases classified elsewhere: Secondary | ICD-10-CM

## 2019-01-17 DIAGNOSIS — N760 Acute vaginitis: Secondary | ICD-10-CM

## 2019-01-17 LAB — COMPREHENSIVE METABOLIC PANEL
ALT: 8 U/L (ref 0–44)
AST: 13 U/L — ABNORMAL LOW (ref 15–41)
Albumin: 4 g/dL (ref 3.5–5.0)
Alkaline Phosphatase: 57 U/L (ref 38–126)
Anion gap: 8 (ref 5–15)
BUN: 11 mg/dL (ref 6–20)
CO2: 25 mmol/L (ref 22–32)
Calcium: 9 mg/dL (ref 8.9–10.3)
Chloride: 108 mmol/L (ref 98–111)
Creatinine, Ser: 0.8 mg/dL (ref 0.44–1.00)
GFR calc Af Amer: >60 mL/min (ref >60)
GFR calc non Af Amer: >60 mL/min (ref >60)
Glucose, Bld: 113 mg/dL — ABNORMAL HIGH (ref 70–99)
Potassium: 3.6 mmol/L (ref 3.5–5.1)
Sodium: 141 mmol/L (ref 135–145)
Total Bilirubin: 0.3 mg/dL (ref 0.3–1.2)
Total Protein: 7.7 g/dL (ref 6.5–8.1)

## 2019-01-17 LAB — URINALYSIS, ROUTINE W REFLEX MICROSCOPIC
Bilirubin Urine: NEGATIVE
Glucose, UA: NEGATIVE mg/dL
Ketones, ur: NEGATIVE mg/dL
Leukocytes,Ua: NEGATIVE
Nitrite: NEGATIVE
Protein, ur: 30 mg/dL — AB
Specific Gravity, Urine: 1.017 (ref 1.005–1.030)
pH: 6 (ref 5.0–8.0)

## 2019-01-17 LAB — CBC
HCT: 39.5 % (ref 36.0–46.0)
Hemoglobin: 12.6 g/dL (ref 12.0–15.0)
MCH: 29 pg (ref 26.0–34.0)
MCHC: 31.9 g/dL (ref 30.0–36.0)
MCV: 91 fL (ref 80.0–100.0)
Platelets: 354 10*3/uL (ref 150–400)
RBC: 4.34 MIL/uL (ref 3.87–5.11)
RDW: 12 % (ref 11.5–15.5)
WBC: 8 10*3/uL (ref 4.0–10.5)
nRBC: 0 % (ref 0.0–0.2)

## 2019-01-17 LAB — WET PREP, GENITAL
Sperm: NONE SEEN
Trich, Wet Prep: NONE SEEN
Yeast Wet Prep HPF POC: NONE SEEN

## 2019-01-17 LAB — TYPE AND SCREEN
ABO/RH(D): A POS
Antibody Screen: NEGATIVE

## 2019-01-17 LAB — ABO/RH: ABO/RH(D): A POS

## 2019-01-17 LAB — PREGNANCY, URINE: Preg Test, Ur: NEGATIVE

## 2019-01-17 LAB — LIPASE, BLOOD: Lipase: 37 U/L (ref 11–51)

## 2019-01-17 MED ORDER — KETOROLAC TROMETHAMINE 30 MG/ML IJ SOLN
60.0000 mg | Freq: Once | INTRAMUSCULAR | Status: AC
  Administered 2019-01-17: 60 mg via INTRAMUSCULAR
  Filled 2019-01-17: qty 2

## 2019-01-17 MED ORDER — METRONIDAZOLE 500 MG PO TABS: 500.0000 mg | ORAL_TABLET | Freq: Two times a day (BID) | ORAL | 0 refills | Status: DC

## 2019-01-17 MED ORDER — METRONIDAZOLE 500 MG PO TABS
500.0000 mg | ORAL_TABLET | Freq: Once | ORAL | Status: AC
  Administered 2019-01-17: 500 mg via ORAL
  Filled 2019-01-17: qty 1

## 2019-01-17 MED ORDER — SODIUM CHLORIDE 0.9% FLUSH: 3.0000 mL | Freq: Once | INTRAVENOUS | Status: DC

## 2019-01-17 NOTE — ED Notes (Signed)
Pelvic Cart set up and ready at bedside 

## 2019-01-17 NOTE — Discharge Instructions (Signed)
You may alternate Tylenol 1000 mg every 6 hours as needed for pain and Ibuprofen 800 mg every 8 hours as needed for pain.  Please take Ibuprofen with food. ° ° ° °[place] [facility]: ° °[facility] °[address] °[place], [place] °[phone] ° °[facility] °[address] °[place], [place] °[phone] ° °[facility] °[address] °[place], [place] °[phone]  ° °[facility] °[address] °[place], [place] °[phone] ° °[facility] °[address] °[place], [place] °[phone]  ° °[facility] °[address] °[place], [place] °[phone]  ° °[facility] °[address] °[place], [place] °[phone]  ° °[facility] °[address] °[place],  °[phone]  ° °[facility] °[address] °[place],  °[phone] ° ° °

## 2019-01-17 NOTE — ED Triage Notes (Addendum)
Patient complaining of heavy vaginal bleeding and having two menstrual cycles a month. Patient states it feels like everything in her vagina is about to fall out. Patient is having cramping pain in lower abdomen.

## 2019-01-17 NOTE — ED Provider Notes (Signed)
TIME SEEN: 1:42 AM  CHIEF COMPLAINT: Vaginal bleeding  HPI: Patient is a 28 year old female with history of dysfunctional uterine bleeding who presents emergency department with vaginal bleeding.  States she has had irregular periods for over 2 years.  States that she will have bleeding at least twice a month.  States she has been bleeding for the entire month of August.  She did have some abnormal discharge several weeks ago.  Sexually active with one female partner.  Has not had an STD since she was 28 years old.  She is not concerned for STDs today.  No previous pregnancies but states she has had to have a D&C secondary to her dysfunctional uterine bleeding.  Previous OB/GYN was with [facility].  She is not on blood thinners.  She reports lower abdominal cramping.  No fevers, chills, nausea, vomiting, diarrhea, dysuria.  ROS: See HPI Constitutional: no fever  Eyes: no drainage  ENT: no runny nose   Cardiovascular:  no chest pain  Resp: no SOB  GI: no vomiting GU: no dysuria Integumentary: no rash  Allergy: no hives  Musculoskeletal: no leg swelling  Neurological: no slurred speech ROS otherwise negative  PAST MEDICAL HISTORY/PAST SURGICAL HISTORY:  Past Medical History:  Diagnosis Date  . Medical history non-contributory     MEDICATIONS:  Prior to Admission medications   Medication Sig Start Date End Date Taking? Authorizing Provider  ibuprofen (ADVIL,MOTRIN) 800 MG tablet Take 1 tablet (800 mg total) by mouth 3 (three) times daily. 06/10/16   Law, Bea Graff, PA-C  metroNIDAZOLE (FLAGYL) 500 MG tablet Take 1 tablet (500 mg total) by mouth 2 (two) times daily. 09/13/15   Janora Norlander, DO    ALLERGIES:  No Known Allergies  SOCIAL HISTORY:  Social History   Tobacco Use  . Smoking status: Current Every Day Smoker    Types: Cigarettes  . Smokeless tobacco: Never Used  Substance Use Topics  . Alcohol use: No    FAMILY HISTORY: Family History  Problem Relation  Age of Onset  . Hypertension Mother   . Diabetes Mother     EXAM: BP 107/87 (BP Location: Right Arm)   Pulse 73   Temp 98.6 F (37 C) (Oral)   Resp 16   Ht 5\' 7"  (1.702 m)   Wt 82.5 kg   LMP 01/14/2019   SpO2 98%   BMI 28.49 kg/m  CONSTITUTIONAL: Alert and oriented and responds appropriately to questions. Well-appearing; well-nourished HEAD: Normocephalic EYES: Conjunctivae clear, pupils appear equal, EOMI ENT: normal nose; moist mucous membranes NECK: Supple, no meningismus, no nuchal rigidity, no LAD  CARD: RRR; S1 and S2 appreciated; no murmurs, no clicks, no rubs, no gallops RESP: Normal chest excursion without splinting or tachypnea; breath sounds clear and equal bilaterally; no wheezes, no rhonchi, no rales, no hypoxia or respiratory distress, speaking full sentences ABD/GI: Normal bowel sounds; non-distended; soft, mildly tender throughout the pelvic region, no rebound, no guarding, no peritoneal signs, no hepatosplenomegaly GU:  Normal external genitalia. No lesions, rashes noted. Patient has small amount of dark red vaginal bleeding on exam.  No significant vaginal discharge.  No adnexal tenderness, mass or fullness, no cervical motion tenderness. Cervix is not appear friable.  Cervix is closed.  Chaperone present for exam. BACK:  The back appears normal and is non-tender to palpation, there is no CVA tenderness EXT: Normal ROM in all joints; non-tender to palpation; no edema; normal capillary refill; no cyanosis, no calf tenderness or swelling  SKIN: Normal color for age and race; warm; no rash NEURO: Moves all extremities equally PSYCH: The patient's mood and manner are appropriate. Grooming and personal hygiene are appropriate.  MEDICAL DECISION MAKING: Patient here with increasing pelvic pain and abnormal vaginal bleeding.  Pregnancy test is negative.  Transvaginal ultrasound obtained which shows normal blood flow to both ovaries and no acute abnormality.  Wet prep is  positive for clue cells.  Does report recent abnormal discharge.  Will treat with Flagyl for 1 week.  Pain improved after IM Toradol.  She does not have any signs of hemorrhaging today and hemoglobin is normal.  Normal vital signs.  Recommended outpatient follow-up with OB/GYN.  Recommended alternating Tylenol and Motrin for pain control.  Discussed return precautions.  Exam not consistent with PID, cervicitis.  GC and chlamydia swabs pending.  At this time, I do not feel there is any life-threatening condition present. I have reviewed and discussed all results (EKG, imaging, lab, urine as appropriate) and exam findings with patient/family. I have reviewed nursing notes and appropriate previous records.  I feel the patient is safe to be discharged home without further emergent workup and can continue workup as an outpatient as needed. Discussed usual and customary return precautions. Patient/family verbalize understanding and are comfortable with this plan.  Outpatient follow-up has been provided as needed. All questions have been answered.      Clemma Johnsen, Layla MawKristen N, DO 01/17/19 [phone]

## 2019-01-18 LAB — GC/CHLAMYDIA PROBE AMP (~~LOC~~) NOT AT ARMC
Chlamydia: NEGATIVE
Neisseria Gonorrhea: NEGATIVE

## 2020-06-12 ENCOUNTER — Other Ambulatory Visit: Payer: Medicaid Other

## 2020-06-12 ENCOUNTER — Other Ambulatory Visit: Payer: Self-pay

## 2020-06-12 DIAGNOSIS — Z20822 Contact with and (suspected) exposure to covid-19: Secondary | ICD-10-CM

## 2020-06-12 NOTE — Addendum Note (Signed)
Addended by: Randa Lynn T on: 06/12/2020 04:09 PM   Modules accepted: Orders

## 2020-06-16 LAB — NOVEL CORONAVIRUS, NAA: SARS-CoV-2, NAA: DETECTED — AB

## 2020-07-22 ENCOUNTER — Encounter (HOSPITAL_COMMUNITY): Payer: Self-pay | Admitting: Emergency Medicine

## 2020-07-22 ENCOUNTER — Emergency Department (HOSPITAL_COMMUNITY)
Admission: EM | Admit: 2020-07-22 | Discharge: 2020-07-22 | Disposition: A | Discharge status: Home / Self Care | Payer: Medicaid Other | Attending: Emergency Medicine | Admitting: Emergency Medicine

## 2020-07-22 DIAGNOSIS — R519 Headache, unspecified: Secondary | ICD-10-CM | POA: Insufficient documentation

## 2020-07-22 DIAGNOSIS — Z5321 Procedure and treatment not carried out due to patient leaving prior to being seen by health care provider: Secondary | ICD-10-CM | POA: Insufficient documentation

## 2020-07-22 DIAGNOSIS — Z8616 Personal history of COVID-19: Secondary | ICD-10-CM | POA: Insufficient documentation

## 2020-07-22 NOTE — ED Notes (Signed)
Pt adds at end of triage that she does use cocaine and believes she has a hole in her nose it appear swollen

## 2020-07-22 NOTE — ED Notes (Signed)
Pt left due to wait time, removed pt name from list.

## 2020-07-22 NOTE — ED Triage Notes (Signed)
Pt arrives today with chief complaint of frontal headache for last 3 days. She has been taking motrin with no relief. No fever or cough, did have covid in January

## 2020-08-14 ENCOUNTER — Emergency Department (HOSPITAL_COMMUNITY)
Admission: EM | Admit: 2020-08-14 | Discharge: 2020-08-14 | Disposition: A | Discharge status: Home / Self Care | Payer: Self-pay | Attending: Emergency Medicine | Admitting: Emergency Medicine

## 2020-08-14 ENCOUNTER — Encounter (HOSPITAL_COMMUNITY): Payer: Self-pay

## 2020-08-14 ENCOUNTER — Other Ambulatory Visit: Payer: Self-pay

## 2020-08-14 ENCOUNTER — Emergency Department (HOSPITAL_COMMUNITY): Payer: Self-pay

## 2020-08-14 DIAGNOSIS — L03211 Cellulitis of face: Secondary | ICD-10-CM | POA: Insufficient documentation

## 2020-08-14 DIAGNOSIS — F17210 Nicotine dependence, cigarettes, uncomplicated: Secondary | ICD-10-CM | POA: Insufficient documentation

## 2020-08-14 DIAGNOSIS — J3489 Other specified disorders of nose and nasal sinuses: Secondary | ICD-10-CM | POA: Insufficient documentation

## 2020-08-14 LAB — COMPREHENSIVE METABOLIC PANEL
ALT: 23 U/L (ref 0–44)
AST: 25 U/L (ref 15–41)
Albumin: 3.7 g/dL (ref 3.5–5.0)
Alkaline Phosphatase: 56 U/L (ref 38–126)
Anion gap: 8 (ref 5–15)
BUN: 9 mg/dL (ref 6–20)
CO2: 23 mmol/L (ref 22–32)
Calcium: 9 mg/dL (ref 8.9–10.3)
Chloride: 107 mmol/L (ref 98–111)
Creatinine, Ser: 0.41 mg/dL — ABNORMAL LOW (ref 0.44–1.00)
GFR, Estimated: >60 mL/min (ref >60)
Glucose, Bld: 121 mg/dL — ABNORMAL HIGH (ref 70–99)
Potassium: 3.7 mmol/L (ref 3.5–5.1)
Sodium: 138 mmol/L (ref 135–145)
Total Bilirubin: 0.5 mg/dL (ref 0.3–1.2)
Total Protein: 7.6 g/dL (ref 6.5–8.1)

## 2020-08-14 LAB — CBC WITH DIFFERENTIAL/PLATELET
Abs Immature Granulocytes: 0.02 10*3/uL (ref 0.00–0.07)
Basophils Absolute: 0 10*3/uL (ref 0.0–0.1)
Basophils Relative: 0 %
Eosinophils Absolute: 0.3 10*3/uL (ref 0.0–0.5)
Eosinophils Relative: 3 %
HCT: 36.4 % (ref 36.0–46.0)
Hemoglobin: 11.4 g/dL — ABNORMAL LOW (ref 12.0–15.0)
Immature Granulocytes: 0 %
Lymphocytes Relative: 23 %
Lymphs Abs: 2.3 10*3/uL (ref 0.7–4.0)
MCH: 27.9 pg (ref 26.0–34.0)
MCHC: 31.3 g/dL (ref 30.0–36.0)
MCV: 89 fL (ref 80.0–100.0)
Monocytes Absolute: 0.6 10*3/uL (ref 0.1–1.0)
Monocytes Relative: 6 %
Neutro Abs: 6.9 10*3/uL (ref 1.7–7.7)
Neutrophils Relative %: 68 %
Platelets: 410 10*3/uL — ABNORMAL HIGH (ref 150–400)
RBC: 4.09 MIL/uL (ref 3.87–5.11)
RDW: 13 % (ref 11.5–15.5)
WBC: 10.3 10*3/uL (ref 4.0–10.5)
nRBC: 0 % (ref 0.0–0.2)

## 2020-08-14 LAB — I-STAT BETA HCG BLOOD, ED (MC, WL, AP ONLY): I-stat hCG, quantitative: <5 m[IU]/mL (ref <5)

## 2020-08-14 MED ORDER — DOXYCYCLINE HYCLATE 100 MG PO CAPS: 100.0000 mg | ORAL_CAPSULE | Freq: Two times a day (BID) | ORAL | 0 refills | Status: DC

## 2020-08-14 MED ORDER — KETOROLAC TROMETHAMINE 30 MG/ML IJ SOLN
30.0000 mg | Freq: Once | INTRAMUSCULAR | Status: AC
  Administered 2020-08-14: 30 mg via INTRAVENOUS
  Filled 2020-08-14: qty 1

## 2020-08-14 MED ORDER — SODIUM CHLORIDE 0.9 % IV BOLUS
1000.0000 mL | Freq: Once | INTRAVENOUS | Status: AC
  Administered 2020-08-14: 1000 mL via INTRAVENOUS

## 2020-08-14 MED ORDER — IOHEXOL 300 MG/ML  SOLN
100.0000 mL | Freq: Once | INTRAMUSCULAR | Status: AC | PRN
  Administered 2020-08-14: 100 mL via INTRAVENOUS

## 2020-08-14 NOTE — ED Provider Notes (Addendum)
[facility]-EMERGENCY DEPT Provider Note   CSN: [id number] Arrival date & time: 08/14/20  1314     History Chief Complaint  Patient presents with  . Migraine    Pamela Costa is a 30 y.o. female with no relevant past medical history presents the ED with a 2-day history of headache.  On my examination, I asked what precipitated her headache symptoms.  She states that she spoke to her mother and said "I want to be honest".  She states that this is the second time that she is experienced a similar headache.  They have both occurred during cocaine withdrawals.  She states that the last time it occurred it went away spontaneously.  However, for the past week she is experienced progressively worsening headache symptoms that she feels is behind her eyes, most notably on the right side.  She also states that she has swelling just to the right of her nose.  She understands that she likely has a deviated septum from her cocaine use, but has never experienced this kind of swelling before.  She states that the pain has been getting progressively worse and she is tearful and embarrassed about today's ED encounter.   HPI     Past Medical History:  Diagnosis Date  . Medical history non-contributory     Patient Active Problem List   Diagnosis Date Noted  . Abdominal pain 08/14/2014  . DUB (dysfunctional uterine bleeding) 08/14/2014  . Encounter for general counseling on prescription of oral contraceptives 08/14/2014    Past Surgical History:  Procedure Laterality Date  . DILATION AND CURETTAGE OF UTERUS       OB History    Gravida  0   Para  0   Term  0   Preterm  0   AB  0   Living  0     SAB  0   IAB  0   Ectopic  0   Multiple  0   Live Births              Family History  Problem Relation Age of Onset  . Hypertension Mother   . Diabetes Mother     Social History   Tobacco Use  . Smoking status: Current Every Day Smoker    Types:  Cigarettes  . Smokeless tobacco: Never Used  Substance Use Topics  . Alcohol use: No  . Drug use: Yes    Types: Marijuana    Comment: last used 4hrs ago    Home Medications Prior to Admission medications   Medication Sig Start Date End Date Taking? Authorizing Provider  doxycycline (VIBRAMYCIN) 100 MG capsule Take 1 capsule (100 mg total) by mouth 2 (two) times daily. 08/14/20  Yes Lorelee New, PA-C  metroNIDAZOLE (FLAGYL) 500 MG tablet Take 1 tablet (500 mg total) by mouth 2 (two) times daily. 01/17/19   Ward, Layla Maw, DO    Allergies    Patient has no known allergies.  Review of Systems   Review of Systems  All other systems reviewed and are negative.   Physical Exam Updated Vital Signs BP 107/67 (BP Location: Left Arm)   Pulse 80   Temp 98 F (36.7 C) (Oral)   Resp 15   SpO2 100%   Physical Exam Vitals and nursing note reviewed. Exam conducted with a chaperone present.  Constitutional:      General: She is not in acute distress.    Appearance: She is ill-appearing.  HENT:     Head: Normocephalic and atraumatic.     Nose: Congestion present.     Comments: Significant inflammation, erythema, blood, and purulence in the right nare.  Superficial TTP over right side of nose extending towards bridge and infraorbitally on right side.  Septal deviation.    Mouth/Throat:     Pharynx: Oropharynx is clear.  Eyes:     General: No scleral icterus.    Conjunctiva/sclera: Conjunctivae normal.  Cardiovascular:     Rate and Rhythm: Normal rate.     Pulses: Normal pulses.  Pulmonary:     Effort: Pulmonary effort is normal.  Musculoskeletal:     Cervical back: Normal range of motion.  Skin:    General: Skin is dry.  Neurological:     Mental Status: She is alert and oriented to person, place, and time.     GCS: GCS eye subscore is 4. GCS verbal subscore is 5. GCS motor subscore is 6.  Psychiatric:        Mood and Affect: Mood normal.        Behavior: Behavior normal.         Thought Content: Thought content normal.     ED Results / Procedures / Treatments   Labs (all labs ordered are listed, but only abnormal results are displayed) Labs Reviewed  CBC WITH DIFFERENTIAL/PLATELET - Abnormal; Notable for the following components:      Result Value   Hemoglobin 11.4 (*)    Platelets 410 (*)    All other components within normal limits  COMPREHENSIVE METABOLIC PANEL - Abnormal; Notable for the following components:   Glucose, Bld 121 (*)    Creatinine, Ser 0.41 (*)    All other components within normal limits  I-STAT BETA HCG BLOOD, ED ([facility], [facility], [facility] ONLY)    EKG None  Radiology CT Maxillofacial W Contrast  Result Date: 08/14/2020 CLINICAL DATA:  Maxillary/facial abscess. Right-sided eye/face pain. After discussion with the ED, patient is a frequent cocaine user. EXAM: CT MAXILLOFACIAL WITH CONTRAST TECHNIQUE: Multidetector CT imaging of the maxillofacial structures was performed with intravenous contrast. Multiplanar CT image reconstructions were also generated. CONTRAST:  OMNIPAQUE IOHEXOL 300 MG/ML  SOLN COMPARISON:  None. FINDINGS: Osseous: No fracture or mandibular dislocation. Orbits: Negative. No traumatic or inflammatory finding. Sinuses: Evidence of prior endoscopic sinus surgery. There is mucosal thickening of the left greater than right maxillary sinuses, bilateral sphenoid sinuses and scattered ethmoid air cells. Fluid within the nasal cavity. Large anterior nasal septal perforation with direct communication of the left maxillary sinus with the nasal cavity. Soft tissues: Soft tissue thickening of the right nasal ala extending to the base of the nose and involving the anterior nasal cavity. Enlarged submandibular and cervical chain nodes. Limited intracranial: No significant or unexpected finding. IMPRESSION: 1. Soft tissue thickening of the right nasal ala extending to the base of the nose and involving the anterior nasal cavity, nonspecific  but concerning for infection/phlegmon. No drainable abscess. Recommend correlation with direct inspection. 2. Large anterior nasal septal perforation, likely related to the patient's reported cocaine use. Communication of the left maxillary sinus within the nasal cavity may be related to cocaine use or prior maxillary antrostomy. 3. Paranasal sinus mucosal thickening, which could represent sinusitis. 4. Enlarged submandibular and cervical chain nodes, most likely reactive given the above findings. Findings discussed with Chilton Si, PA via telephone at 4:14 PM. Electronically Signed   By: Feliberto Harts MD   On: 08/14/2020 16:29  Procedures Procedures   Medications Ordered in ED Medications  sodium chloride 0.9 % bolus 1,000 mL (1,000 mLs Intravenous Bolus 08/14/20 1530)  ketorolac (TORADOL) 30 MG/ML injection 30 mg (30 mg Intravenous Given 08/14/20 1529)  iohexol (OMNIPAQUE) 300 MG/ML solution 100 mL (100 mLs Intravenous Contrast Given 08/14/20 1540)    ED Course  I have reviewed the triage vital signs and the nursing notes.  Pertinent labs & imaging results that were available during my care of the patient were reviewed by me and considered in my medical decision making (see chart for details).  Clinical Course as of 08/14/20 1717  Fri Aug 14, 2020  1649 I spoke with Dr. Jenne Pane, ENT, who reviewed CT findings.  Emphasized the importance of cocaine cessation.  He also advised antibiotics and nasal rinses twice daily for the next couple of weeks.   [GG]    Clinical Course User Index [GG] Lorelee New, PA-C   MDM Rules/Calculators/A&P                          Pamela Costa was evaluated in Emergency Department on 08/14/2020 for the symptoms described in the history of present illness. She was evaluated in the context of the global COVID-19 pandemic, which necessitated consideration that the patient might be at risk for infection with the SARS-CoV-2 virus that causes COVID-19.  Institutional protocols and algorithms that pertain to the evaluation of patients at risk for COVID-19 are in a state of rapid change based on information released by regulatory bodies including the CDC and federal and state organizations. These policies and algorithms were followed during the patient's care in the ED.  I personally reviewed patient's medical chart and all notes from triage and staff during today's encounter. I have also ordered and reviewed all labs and imaging that I felt to be medically necessary in the evaluation of this patient's complaints and with consideration of their physical exam. If needed, translation services were available and utilized.   Patient initially complained of migraine headache, but it appears as though her symptoms are related to her sinuses and subsequent to cocaine use disorder.  There is also swelling noted along right side of note concerning for deep infection.  Will obtain laboratory work-up and proceed with CT imaging.    I spoke with radiology.  CT maxillofacial with contrast demonstrates soft tissue thickening of the right nasal alae extending to the base of the nose and involving the anterior nasal cavity, nonspecific but concerning for infection/phlegmon.  Inflamed, thickened.  There is also a large anterior nasal septal perforation and communication with the left maxillary sinus.  On subsequent evaluation, patient feeling much improved after IV fluids and Toradol.  Will consult with ENT.    I spoke with Dr. Jenne Pane, ENT, who reviewed CT findings.  Emphasized the importance of cocaine cessation.  He also advised antibiotics and nasal rinses twice daily for the next couple of weeks.    ED return precautions discussed.  Patient voiced understanding and is agreeable to the plan.  Final Clinical Impression(s) / ED Diagnoses Final diagnoses:  Cellulitis of face  Nasal septal perforation    Rx / DC Orders ED Discharge Orders         Ordered     doxycycline (VIBRAMYCIN) 100 MG capsule  2 times daily        08/14/20 1713           Ife Vitelli, Sharion Settler, PA-C  08/14/20 1716    Lorelee NewGreen, Rahkeem Senft L, PA-C 08/14/20 1717    Linwood DibblesKnapp, Jon, MD 08/15/20 [phone]

## 2020-08-14 NOTE — ED Notes (Signed)
Pt updated on plan of care. NAD noted. Pt has no other complaints or concerns at this time

## 2020-08-14 NOTE — ED Triage Notes (Signed)
Pt reports migraine that began 2 days ago. Pt reports intermittent nausea. Pt reports she has been taking OTC medication with no relief.

## 2020-08-14 NOTE — Discharge Instructions (Addendum)
Your headache symptoms are likely sinus-related, as evidenced by your CT.    CT findings were particularly concerning and it is imperative that you discontinue your cocaine use.  Please refer to the attached guide for local resources available to you to assist with your substance use disorder.  I strongly encourage you to call around and schedule an appointment.  Please take the doxycycline, as directed.  This is an antibiotic.  Please also continue with ibuprofen 600 mg every 6 hours as needed for pain control.  I also would like for you to use nasal irrigation rinses twice daily for the next 10 days.  This can be obtained over-the-counter at a pharmacy.  Return to the ED or seek immediate medical attention should you experience any new or worsening symptoms.

## 2021-01-20 ENCOUNTER — Encounter (HOSPITAL_COMMUNITY): Payer: Self-pay | Admitting: Emergency Medicine

## 2021-01-20 ENCOUNTER — Emergency Department (HOSPITAL_COMMUNITY)
Admission: EM | Admit: 2021-01-20 | Discharge: 2021-01-20 | Disposition: A | Discharge status: Home / Self Care | Payer: Medicaid Other | Attending: Emergency Medicine | Admitting: Emergency Medicine

## 2021-01-20 DIAGNOSIS — K0889 Other specified disorders of teeth and supporting structures: Secondary | ICD-10-CM | POA: Insufficient documentation

## 2021-01-20 DIAGNOSIS — Z5321 Procedure and treatment not carried out due to patient leaving prior to being seen by health care provider: Secondary | ICD-10-CM | POA: Insufficient documentation

## 2021-01-20 DIAGNOSIS — N764 Abscess of vulva: Secondary | ICD-10-CM | POA: Insufficient documentation

## 2021-01-20 LAB — I-STAT BETA HCG BLOOD, ED (MC, WL, AP ONLY): I-stat hCG, quantitative: <5 m[IU]/mL (ref <5)

## 2021-01-20 NOTE — ED Triage Notes (Signed)
Patient c/o R and L lower dental pain x2 weeks. Also, c/o abscess to R labia x2 days.

## 2021-01-20 NOTE — ED Provider Notes (Signed)
Emergency Medicine Provider Triage Evaluation Note  Pamela Costa , a 30 y.o. female  was evaluated in triage.  Pt complains of dental pain, dysuria, vaginal pruritus.  Patient reports that she has been having vaginal pruritus, vaginal irritation, and dysuria over the last few days.  Patient endorses white vaginal discharge but states this is baseline for her.  Patient reports that she had an episode hematuria 2 days prior.  Patient is sexually active in a mutually monogamous relationship with a female partner.  Patient does not use condoms and is not on birth control.  LMP 7/23.  Patient reports lower right and left dental pain over the last 2 weeks.  Patient reports that she damaged the back left molar over a year ago.  Patient states that she also feels that her gums are swollen.  Review of Systems  Positive: Dysuria, vaginal pruritus, hematuria, dental pain, vaginal discharge Negative: Fever, chills, drooling, trouble swallowing, sore throat, neck stiffness, abdominal pain, nausea, vomiting  Physical Exam  BP 113/84 (BP Location: Left Arm)   Pulse 82   Temp 98.9 F (37.2 C) (Oral)   Resp 16   LMP 12/26/2020   SpO2 100%  Gen:   Awake, no distress   Resp:  Normal effort  MSK:   Moves extremities without difficulty  Other:  Abdomen soft, nondistended, nontender.  Damage to back left molar with dental tenderness.Patient able to handle oral secretions without any difficulty.  Medical Decision Making  Medically screening exam initiated at 1:26 PM.  Appropriate orders placed.  Torunn T Kalisz was informed that the remainder of the evaluation will be completed by another provider, this initial triage assessment does not replace that evaluation, and the importance of remaining in the ED until their evaluation is complete.  The patient appears stable so that the remainder of the work up may be completed by another provider.      Berneice Heinrich 01/20/21 1328    Terrilee Files, MD 01/20/21 1921

## 2021-01-20 NOTE — ED Notes (Signed)
Per registration patient turned in labels and reports she is leaving.

## 2021-01-21 ENCOUNTER — Other Ambulatory Visit: Payer: Self-pay

## 2021-01-21 ENCOUNTER — Encounter (HOSPITAL_COMMUNITY): Payer: Self-pay

## 2021-01-21 ENCOUNTER — Ambulatory Visit (HOSPITAL_COMMUNITY)
Admission: EM | Admit: 2021-01-21 | Discharge: 2021-01-21 | Disposition: A | Discharge status: Home / Self Care | Payer: Medicaid Other | Attending: Family Medicine | Admitting: Family Medicine

## 2021-01-21 DIAGNOSIS — J0100 Acute maxillary sinusitis, unspecified: Secondary | ICD-10-CM

## 2021-01-21 MED ORDER — AMOXICILLIN 875 MG PO TABS: 875.0000 mg | ORAL_TABLET | Freq: Two times a day (BID) | ORAL | 0 refills | Status: AC

## 2021-01-21 MED ORDER — DICLOFENAC SODIUM 75 MG PO TBEC: 75.0000 mg | DELAYED_RELEASE_TABLET | Freq: Two times a day (BID) | ORAL | 0 refills | Status: DC

## 2021-01-21 NOTE — ED Provider Notes (Signed)
[facility]   [id number] 01/21/21 Arrival Time: 1434  ASSESSMENT & PLAN:  1. Acute non-recurrent maxillary sinusitis    Begin: Meds ordered this encounter  Medications   amoxicillin (AMOXIL) 875 MG tablet    Sig: Take 1 tablet (875 mg total) by mouth 2 (two) times daily for 10 days.    Dispense:  20 tablet    Refill:  0   diclofenac (VOLTAREN) 75 MG EC tablet    Sig: Take 1 tablet (75 mg total) by mouth 2 (two) times daily.    Dispense:  14 tablet    Refill:  0   OTC symptom care as needed. Ensure adequate fluid intake and rest.   Follow-up Information     [facility].   Why: As needed. Contact information: [address] [place] [address]         [facility].   Specialty: Urgent Care Why: If worsening or failing to improve as anticipated. Contact information: [address] [place] [address] [phone]                Reviewed expectations re: course of current medical issues. Questions answered. Outlined signs and symptoms indicating need for more acute intervention. Patient verbalized understanding. After Visit Summary given.   SUBJECTIVE: History from: patient.  Pamela Costa is a 30 y.o. female who presents with complaint of nasal congestion, post-nasal drainage, and sinus pain. Onset gradual,  over 2 w ago . Respiratory symptoms: none. Fever: absent. Feels pain in teeth. Overall normal PO intake without n/v. OTC treatment: none. Seasonal allergies: no. History of frequent sinus infections: no. No specific aggravating or alleviating factors reported.  Social History   Tobacco Use  Smoking Status Every Day   Types: Cigarettes  Smokeless Tobacco Never    OBJECTIVE:  Vitals:   01/21/21 1452  BP: (!) 119/53  Pulse: 68  Resp: 17  Temp: 98.4 F (36.9 C)  TempSrc: Oral  SpO2: 100%     General appearance: alert; no distress HEENT: nasal congestion; clear  runny nose; throat irritation secondary to post-nasal drainage; L sided maxillary tenderness to palpation; turbinates boggy Neck: supple without LAD; trachea midline Lungs: unlabored respirations, symmetrical air entry; cough: absent; no respiratory distress Skin: warm and dry Psychological: alert and cooperative; normal mood and affect  No Known Allergies  Past Medical History:  Diagnosis Date   Medical history non-contributory    Family History  Problem Relation Age of Onset   Hypertension Mother    Diabetes Mother    Social History   Socioeconomic History   Marital status: Single    Spouse name: Not on file   Number of children: Not on file   Years of education: Not on file   Highest education level: Not on file  Occupational History   Not on file  Tobacco Use   Smoking status: Every Day    Types: Cigarettes   Smokeless tobacco: Never  Substance and Sexual Activity   Alcohol use: No   Drug use: Yes    Types: Marijuana    Comment: last used 4hrs ago   Sexual activity: Yes    Birth control/protection: Condom  Other Topics Concern   Not on file  Social History Narrative   Not on file   Social Determinants of Health   Financial Resource Strain: Not on file  Food Insecurity: Not on file  Transportation Needs: Not on file  Physical Activity:  Not on file  Stress: Not on file  Social Connections: Not on file  Intimate Partner Violence: Not on file             Mardella Layman, MD 01/21/21 [phone]

## 2021-01-21 NOTE — ED Triage Notes (Signed)
Pt presents with bilateral dental pain for over a week; pt also complains of sinus swelling & pain.

## 2021-10-13 ENCOUNTER — Emergency Department (HOSPITAL_BASED_OUTPATIENT_CLINIC_OR_DEPARTMENT_OTHER)
Admission: EM | Admit: 2021-10-13 | Discharge: 2021-10-13 | Disposition: A | Discharge status: Home / Self Care | Payer: Medicaid Other | Attending: Emergency Medicine | Admitting: Emergency Medicine

## 2021-10-13 ENCOUNTER — Other Ambulatory Visit: Payer: Self-pay

## 2021-10-13 ENCOUNTER — Encounter (HOSPITAL_BASED_OUTPATIENT_CLINIC_OR_DEPARTMENT_OTHER): Payer: Self-pay

## 2021-10-13 DIAGNOSIS — N643 Galactorrhea not associated with childbirth: Secondary | ICD-10-CM | POA: Insufficient documentation

## 2021-10-13 LAB — PREGNANCY, URINE: Preg Test, Ur: NEGATIVE

## 2021-10-13 NOTE — ED Provider Notes (Signed)
?[facility] ?Provider Note ? ? ?CSN: [id number] ?Arrival date & time: 10/13/21  1925 ? ?  ? ?History ? ?Chief Complaint  ?Patient presents with  ? Breast Discharge  ? ? ?Pamela Costa is a 31 y.o. female. ? ?HPI ?31 year old female presents with about 3 days of bilateral breast tenderness and discharge.  She notes that the left breast started first and she noticed it was sore and tender diffusely.  She has had some yellow discharge that she can express.  Now she is having right breast pain and discharge as well.  No fevers or color change.  No swelling.  No recent pregnancy or breast-feeding.  She has not missed any menstrual cycles.  Denies any headaches or abnormal weight loss/weight gain. ? ?Home Medications ?Prior to Admission medications   ?Medication Sig Start Date End Date Taking? Authorizing Provider  ?diclofenac (VOLTAREN) 75 MG EC tablet Take 1 tablet (75 mg total) by mouth 2 (two) times daily. 01/21/21   Mardella Layman, MD  ?metroNIDAZOLE (FLAGYL) 500 MG tablet Take 1 tablet (500 mg total) by mouth 2 (two) times daily. 01/17/19   Ward, Layla Maw, DO  ?   ? ?Allergies    ?Patient has no known allergies.   ? ?Review of Systems   ?Review of Systems  ?Constitutional:  Negative for fever.  ?Genitourinary:  Negative for menstrual problem.  ?Neurological:  Negative for headaches.  ? ?Physical Exam ?Updated Vital Signs ?BP 110/70 (BP Location: Right Arm)   Pulse 88   Temp 98.9 ?F (37.2 ?C) (Oral)   Resp 16   Ht 5\' 7"  (1.702 m)   Wt 81.6 kg   LMP 09/22/2021 (Approximate)   SpO2 100%   BMI 28.19 kg/m?  ?Physical Exam ?Vitals and nursing note reviewed. Exam conducted with a chaperone present.  ?Constitutional:   ?   Appearance: She is well-developed.  ?HENT:  ?   Head: Normocephalic and atraumatic.  ?Pulmonary:  ?   Effort: Pulmonary effort is normal.  ?Chest:  ?   Chest wall: Tenderness present. No swelling.  ?   Comments: Patient reports diffuse tenderness symmetrically around  the areola.  I do not appreciate any obvious nipple discharge, erythema or mastitis.  After a lot of manipulation, the patient is able to express a very small amount of mostly clear but at 1 point slightly yellow discharge out of her left breast. ?Abdominal:  ?   General: There is no distension.  ?Skin: ?   General: Skin is warm and dry.  ?Neurological:  ?   Mental Status: She is alert.  ? ? ?ED Results / Procedures / Treatments   ?Labs ?(all labs ordered are listed, but only abnormal results are displayed) ?Labs Reviewed  ?PREGNANCY, URINE  ? ? ?EKG ?None ? ?Radiology ?No results found. ? ?Procedures ?Procedures  ? ? ?Medications Ordered in ED ?Medications - No data to display ? ?ED Course/ Medical Decision Making/ A&P ?  ?                        ?Medical Decision Making ?Amount and/or Complexity of Data Reviewed ?Labs: ordered. ? ? ?Pregnancy test is negative.  Exam performed with chaperone shows no evidence of mastitis or obvious infection.  Given this is bilateral, I am less suspicious for an emergent cause of nipple drainage.  We discussed that we could do some blood work including a prolactin level but at this point she prefers to  just follow-up with GYN.  Vital signs are normal.  Discussed return precautions. ? ? ? ? ? ? ? ?Final Clinical Impression(s) / ED Diagnoses ?Final diagnoses:  ?Galactorrhea  ? ? ?Rx / DC Orders ?ED Discharge Orders   ? ? None  ? ?  ? ? ?  ?Pricilla Loveless, MD ?10/13/21 2325 ? ?

## 2021-10-13 NOTE — ED Notes (Addendum)
Pt complaint of breast tenderness and small amount of drainage form nipple. Pt stated drainage is yellow in color.  ?

## 2021-10-13 NOTE — Discharge Instructions (Addendum)
If you develop fever, redness to the breast or nipple, change in the discharge color, or any other new/concerning symptoms then return to the ER for evaluation. ?

## 2021-10-13 NOTE — ED Triage Notes (Signed)
Bilateral breast tenderness and yellow discharge x 3 days.  ?

## 2022-07-13 IMAGING — CT CT MAXILLOFACIAL W/ CM
3 series · 15 of 47 positions shown, 18 images · IV contrast (omnipaque)
Comparison: None.

CLINICAL DATA: Maxillary/facial abscess. Right-sided eye/face pain.
After discussion with the ED, patient is a frequent cocaine user.

EXAM:
CT MAXILLOFACIAL WITH CONTRAST
TECHNIQUE: Multidetector CT imaging of the maxillofacial structures was
performed with intravenous contrast. Multiplanar CT image
reconstructions were also generated.
CONTRAST:  100mL OMNIPAQUE IOHEXOL 300 MG/ML  SOLN

[Series 3: max soft · axial · 0.35mm/px · z∈[-248,-72]mm · 9 of 104 slices shown, 12 images]
[im 8/104  brain]
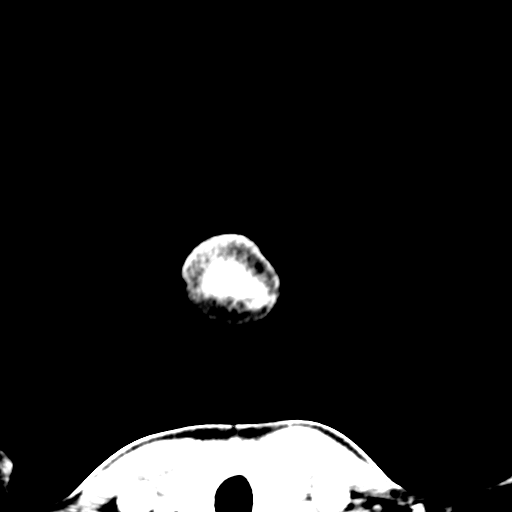
[im 8/104  bone]
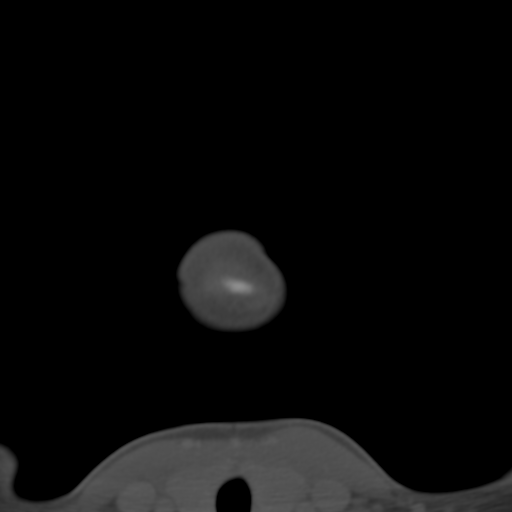
[im 18/104  bone]
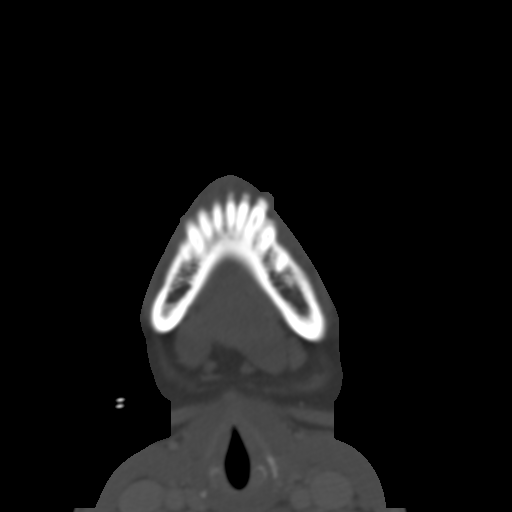
[im 29/104  bone]
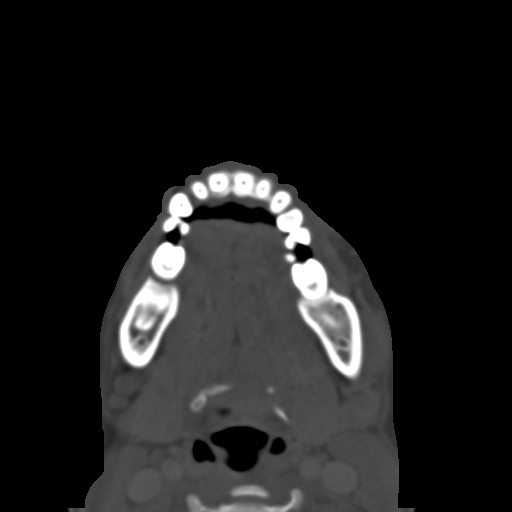
[im 40/104  bone]
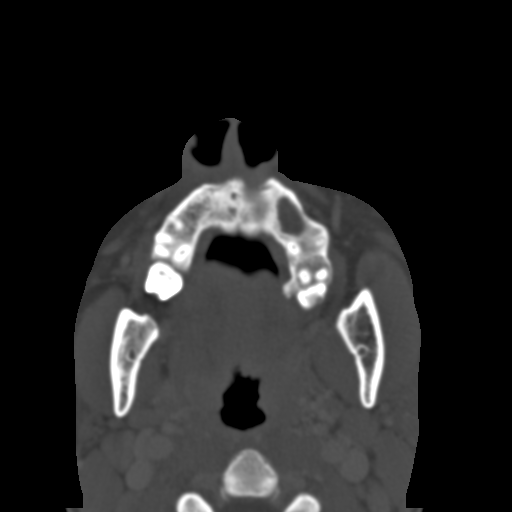
[im 54/104  brain]
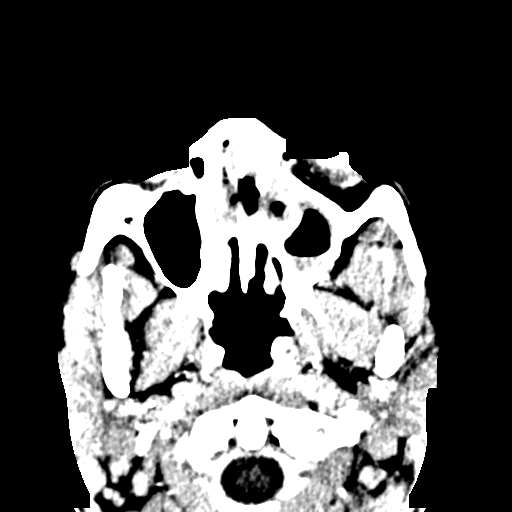
[im 54/104  bone]
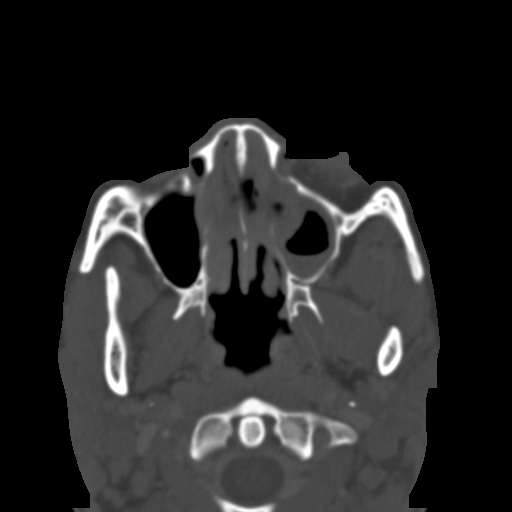
[im 64/104  bone]
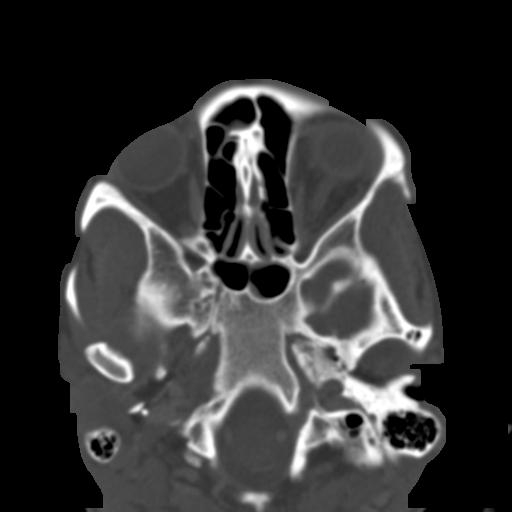
[im 75/104  bone]
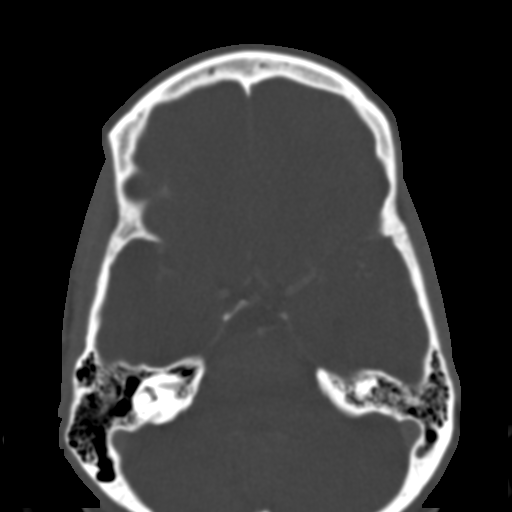
[im 86/104  bone]
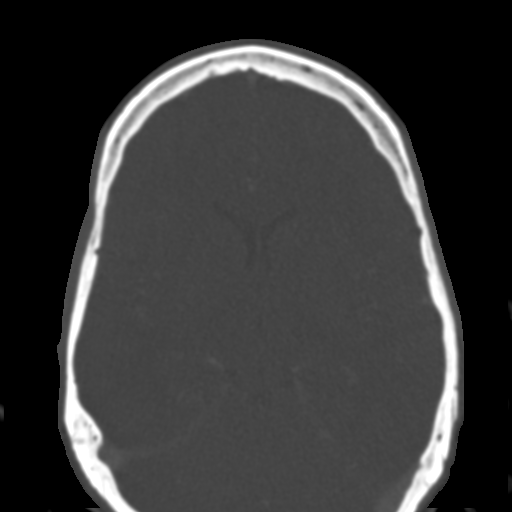
[im 96/104  brain]
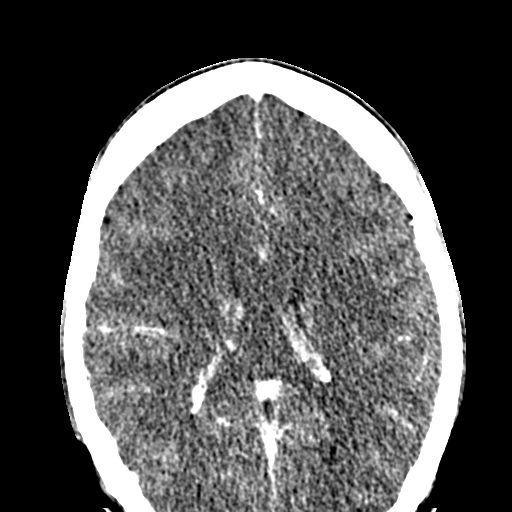
[im 96/104  bone]
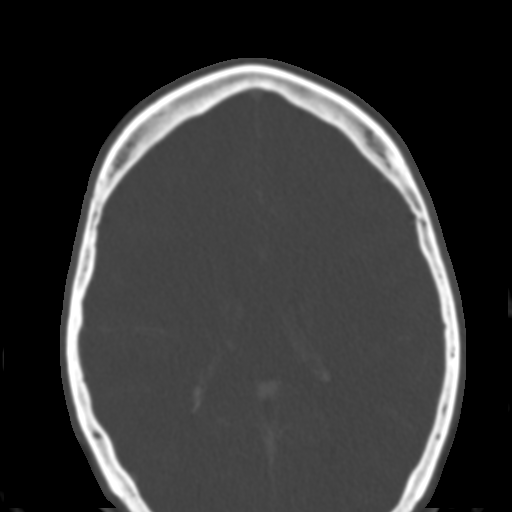

[Series 5: coronal soft · coronal · 0.38mm/px · 3 of 91 slices shown]
[im 31/91  bone]
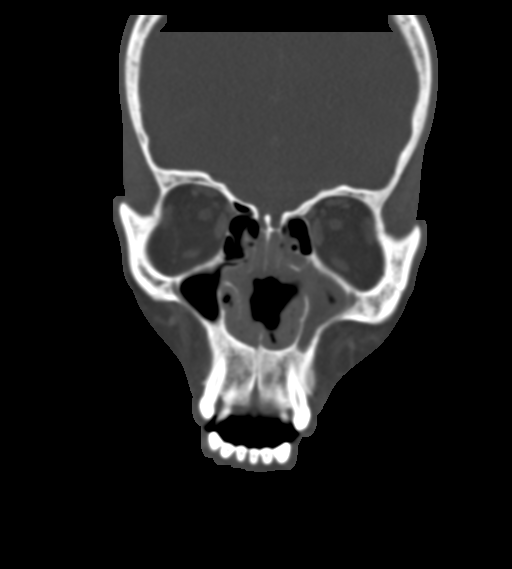
[im 41/91  bone]
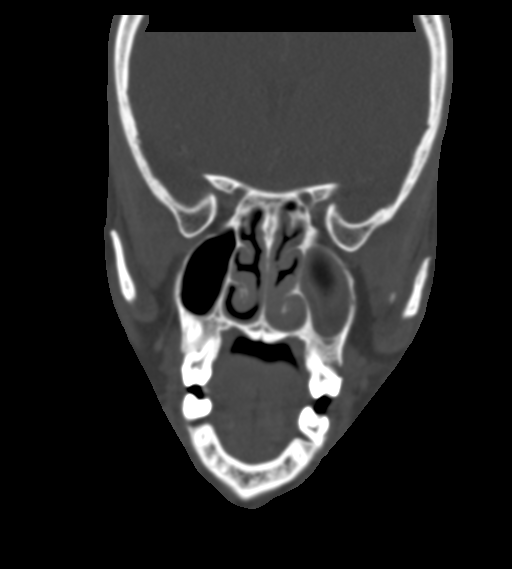
[im 51/91  bone]
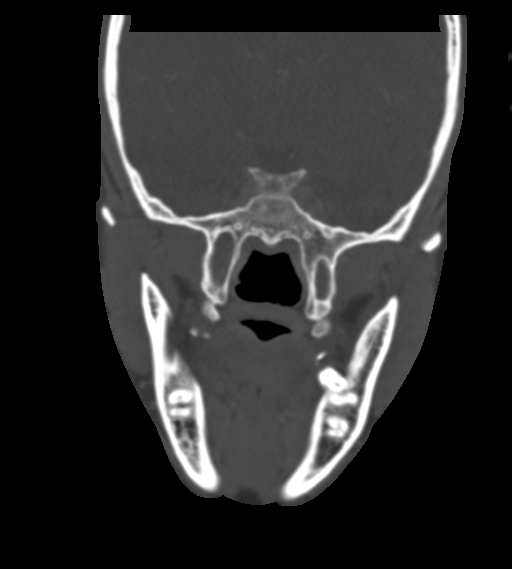

[Series 6: sagittal soft · sagittal · 0.43mm/px · 3 of 83 slices shown]
[im 28/83  bone]
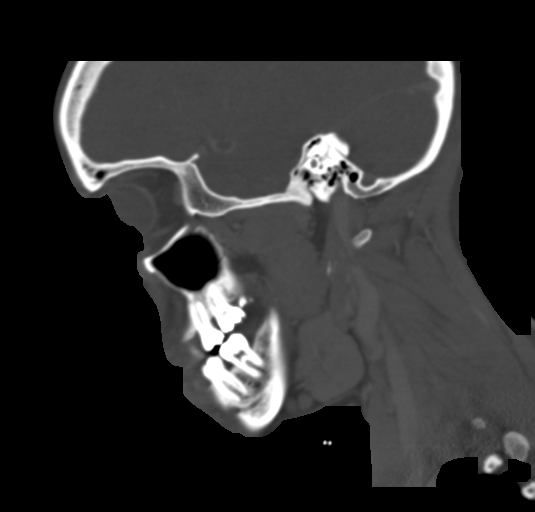
[im 42/83  bone]
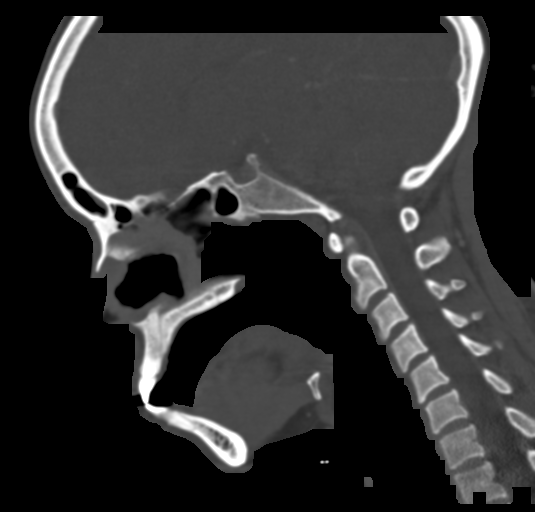
[im 55/83  bone]
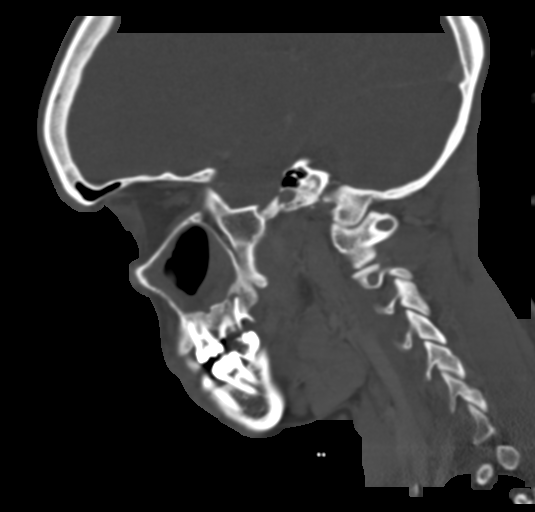

[15 of 47 positions shown; findings below may reference images not displayed]

FINDINGS: Osseous: No fracture or mandibular dislocation.

Orbits: Negative. No traumatic or inflammatory finding.

Sinuses: Evidence of prior endoscopic sinus surgery. There is
mucosal thickening of the left greater than right maxillary sinuses,
bilateral sphenoid sinuses and scattered ethmoid air cells. Fluid
within the nasal cavity. Large anterior nasal septal perforation
with direct communication of the left maxillary sinus with the nasal
cavity.

Soft tissues: Soft tissue thickening of the right nasal ala
extending to the base of the nose and involving the anterior nasal
cavity. Enlarged submandibular and cervical chain nodes.

Limited intracranial: No significant or unexpected finding.
IMPRESSION: 1. Soft tissue thickening of the right nasal ala extending to the
base of the nose and involving the anterior nasal cavity,
nonspecific but concerning for infection/phlegmon. No drainable
abscess. Recommend correlation with direct inspection.
2. Large anterior nasal septal perforation, likely related to the
patient's reported cocaine use. Communication of the left maxillary
sinus within the nasal cavity may be related to cocaine use or prior
maxillary antrostomy.
3. Paranasal sinus mucosal thickening, which could represent
sinusitis.
4. Enlarged submandibular and cervical chain nodes, most likely
reactive given the above findings.

Findings discussed with [REDACTED] via telephone at [DATE].

## 2023-04-17 ENCOUNTER — Encounter (HOSPITAL_COMMUNITY): Payer: Self-pay

## 2023-04-17 ENCOUNTER — Telehealth (HOSPITAL_COMMUNITY): Payer: Self-pay | Admitting: Emergency Medicine

## 2023-04-17 ENCOUNTER — Ambulatory Visit (HOSPITAL_COMMUNITY)
Admission: EM | Admit: 2023-04-17 | Discharge: 2023-04-17 | Disposition: A | Discharge status: Home / Self Care | Payer: Medicaid Other | Attending: Emergency Medicine | Admitting: Emergency Medicine

## 2023-04-17 DIAGNOSIS — J3489 Other specified disorders of nose and nasal sinuses: Secondary | ICD-10-CM

## 2023-04-17 DIAGNOSIS — J019 Acute sinusitis, unspecified: Secondary | ICD-10-CM

## 2023-04-17 MED ORDER — AMOXICILLIN-POT CLAVULANATE 875-125 MG PO TABS: 1.0000 | ORAL_TABLET | Freq: Two times a day (BID) | ORAL | 0 refills | Status: DC

## 2023-04-17 MED ORDER — AMOXICILLIN-POT CLAVULANATE 875-125 MG PO TABS: 1.0000 | ORAL_TABLET | Freq: Two times a day (BID) | ORAL | 0 refills | Status: AC

## 2023-04-17 NOTE — ED Provider Notes (Signed)
[facility]    CSN: [id number] Arrival date & time: 04/17/23  1316      History   Chief Complaint Chief Complaint  Patient presents with   Headache    HPI Pamela Costa is a 32 y.o. female.   Patient presents to clinic with sinus pain and pressure for the past 2 days.  Feels like the back of her eyes are hurting so bad that they are watery.  Also her teeth are hurting.  Reports her nasal discharge is brown/green.   She does have a few holes in her teeth, but does not think that these are the issue today.   She denies cough.  No sore throat.  She has felt warm at night, has not checked her temperature.  Was seen in March 2022 for a cocaine withdrawal headache, CT head showed an infection and septal perforation. Reports she has not used cocaine in 6 months.   The history is provided by the patient and medical records.    Past Medical History:  Diagnosis Date   Medical history non-contributory     Patient Active Problem List   Diagnosis Date Noted   Abdominal pain 08/14/2014   DUB (dysfunctional uterine bleeding) 08/14/2014   Encounter for general counseling on prescription of oral contraceptives 08/14/2014    Past Surgical History:  Procedure Laterality Date   DILATION AND CURETTAGE OF UTERUS      OB History     Gravida  0   Para  0   Term  0   Preterm  0   AB  0   Living  0      SAB  0   IAB  0   Ectopic  0   Multiple  0   Live Births               Home Medications    Prior to Admission medications   Medication Sig Start Date End Date Taking? Authorizing Provider  amoxicillin-clavulanate (AUGMENTIN) 875-125 MG tablet Take 1 tablet by mouth every 12 (twelve) hours. 04/17/23  Yes Rumaldo Difatta, [place] N, FNP    Family History Family History  Problem Relation Age of Onset   Hypertension Mother    Diabetes Mother     Social History Social History   Tobacco Use   Smoking status: Every Day    Types: Cigarettes    Smokeless tobacco: Never  Substance Use Topics   Alcohol use: No   Drug use: Yes    Types: Marijuana    Comment: last used 4hrs ago     Allergies   Patient has no known allergies.   Review of Systems Review of Systems  Per HPI   Physical Exam Triage Vital Signs ED Triage Vitals  Encounter Vitals Group     BP      Systolic BP Percentile      Diastolic BP Percentile      Pulse      Resp      Temp      Temp src      SpO2      Weight      Height      Head Circumference      Peak Flow      Pain Score      Pain Loc      Pain Education      Exclude from Growth Chart    No data found.  Updated Vital Signs BP 136/83 (BP Location:  Left Arm)   Pulse 72   Temp 99 F (37.2 C) (Oral)   Resp 16   LMP 04/14/2023   SpO2 97%   Visual Acuity Right Eye Distance:   Left Eye Distance:   Bilateral Distance:    Right Eye Near:   Left Eye Near:    Bilateral Near:     Physical Exam Vitals and nursing note reviewed.  Constitutional:      Appearance: Normal appearance. She is well-developed.  HENT:     Head: Normocephalic and atraumatic.     Right Ear: External ear normal.     Left Ear: External ear normal.     Nose: Congestion and rhinorrhea present.     Right Sinus: Maxillary sinus tenderness and frontal sinus tenderness present.     Left Sinus: Maxillary sinus tenderness and frontal sinus tenderness present.     Comments: Septal perforation.     Mouth/Throat:     Mouth: Mucous membranes are moist.     Pharynx: Posterior oropharyngeal erythema present.  Eyes:     Conjunctiva/sclera: Conjunctivae normal.  Cardiovascular:     Rate and Rhythm: Normal rate and regular rhythm.     Heart sounds: Normal heart sounds. No murmur heard. Pulmonary:     Effort: Pulmonary effort is normal. No respiratory distress.  Musculoskeletal:        General: Normal range of motion.  Skin:    General: Skin is warm and dry.  Neurological:     General: No focal deficit present.      Mental Status: She is alert and oriented to person, place, and time.  Psychiatric:        Mood and Affect: Mood normal.        Behavior: Behavior normal. Behavior is cooperative.      UC Treatments / Results  Labs (all labs ordered are listed, but only abnormal results are displayed) Labs Reviewed - No data to display  EKG   Radiology No results found.  Procedures Procedures (including critical care time)  Medications Ordered in UC Medications - No data to display  Initial Impression / Assessment and Plan / UC Course  I have reviewed the triage vital signs and the nursing notes.  Pertinent labs & imaging results that were available during my care of the patient were reviewed by me and considered in my medical decision making (see chart for details).  Vitals and triage reviewed, patient is hemodynamically stable.  Diffuse sinus tenderness to palpation.  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  Will cover with Augmentin due to high risk for complications.  Provided with nasal saline irrigation kit in clinic.  Work note provided.  Plan of care, follow-up care and return precautions given, no questions at this time.     Final Clinical Impressions(s) / UC Diagnoses   Final diagnoses:  Acute sinusitis, recurrence not specified, unspecified location  Nasal septal perforation     Discharge Instructions      I am covering it with Augmentin for bacterial sinusitis.  Take all antibiotics as prescribed and until finished, you can take them with food to prevent gastrointestinal upset.  Please do sinus rinses with filtered water or water from water bottles twice daily to help manually clear your sinuses.  You can alternate between 800 mg of ibuprofen and 500 mg of Tylenol for any headache or pain.  Return to clinic for any new or urgent symptoms or if no improvement despite antibiotics.  ED Prescriptions     Medication Sig Dispense Auth.  Provider   amoxicillin-clavulanate (AUGMENTIN) 875-125 MG tablet Take 1 tablet by mouth every 12 (twelve) hours. 14 tablet Shatara Stanek, [place] N, [place]      PDMP not reviewed this encounter.   Ellyanna Holton, [place] N, [place] 04/17/23 1544

## 2023-04-17 NOTE — ED Triage Notes (Signed)
Headache.  Pt states she had a headache x 2 days. Pt states facial pain and her teeth hurt. Pt reports she has two holes in her teeth.

## 2023-04-17 NOTE — Discharge Instructions (Signed)
I am covering it with Augmentin for bacterial sinusitis.  Take all antibiotics as prescribed and until finished, you can take them with food to prevent gastrointestinal upset.  Please do sinus rinses with filtered water or water from water bottles twice daily to help manually clear your sinuses.  You can alternate between 800 mg of ibuprofen and 500 mg of Tylenol for any headache or pain.  Return to clinic for any new or urgent symptoms or if no improvement despite antibiotics.

## 2024-06-20 ENCOUNTER — Emergency Department (HOSPITAL_BASED_OUTPATIENT_CLINIC_OR_DEPARTMENT_OTHER)
Admission: EM | Admit: 2024-06-20 | Discharge: 2024-06-20 | Disposition: A | Discharge status: Home / Self Care | Payer: Self-pay | Attending: Emergency Medicine | Admitting: Emergency Medicine

## 2024-06-20 ENCOUNTER — Other Ambulatory Visit: Payer: Self-pay

## 2024-06-20 DIAGNOSIS — R300 Dysuria: Secondary | ICD-10-CM | POA: Insufficient documentation

## 2024-06-20 DIAGNOSIS — J3489 Other specified disorders of nose and nasal sinuses: Secondary | ICD-10-CM | POA: Insufficient documentation

## 2024-06-20 DIAGNOSIS — L989 Disorder of the skin and subcutaneous tissue, unspecified: Secondary | ICD-10-CM

## 2024-06-20 LAB — WET PREP, GENITAL
Clue Cells Wet Prep HPF POC: NONE SEEN
Sperm: NONE SEEN
Trich, Wet Prep: NONE SEEN
WBC, Wet Prep HPF POC: <10
Yeast Wet Prep HPF POC: NONE SEEN

## 2024-06-20 LAB — URINALYSIS, ROUTINE W REFLEX MICROSCOPIC
Bilirubin Urine: NEGATIVE
Glucose, UA: NEGATIVE mg/dL
Ketones, ur: NEGATIVE mg/dL
Leukocytes,Ua: NEGATIVE
Nitrite: NEGATIVE
Specific Gravity, Urine: 1.043 — ABNORMAL HIGH (ref 1.005–1.030)
pH: 6 (ref 5.0–8.0)

## 2024-06-20 LAB — PREGNANCY, URINE: Preg Test, Ur: NEGATIVE

## 2024-06-20 MED ORDER — DOXYCYCLINE HYCLATE 100 MG PO CAPS: 100.0000 mg | ORAL_CAPSULE | Freq: Two times a day (BID) | ORAL | 0 refills | Status: AC

## 2024-06-20 MED ORDER — VALACYCLOVIR HCL 1 G PO TABS: 1000.0000 mg | ORAL_TABLET | Freq: Two times a day (BID) | ORAL | 0 refills | Status: AC

## 2024-06-20 NOTE — ED Provider Notes (Signed)
 " [place] EMERGENCY DEPARTMENT AT [facility] Provider Note   CSN: [id number] Arrival date & time: 06/20/24  1647     Patient presents with: Dysuria   Pamela Costa is a 34 y.o. female.   Patient complains of a swollen tender area in her right nostril.  Patient reports the symptoms began a week ago and progressively gotten worse.  Patient also complains of some discomfort with urination.  Patient states she has had some area of irritation in the vaginal area.  Patient states she thinks it is from where she shaved.  Patient denies any fever or chills.  Patient has not had any vaginal discharge she denies any STD risk.  Patient states she has had cold sores in the past.  The history is provided by the patient. No language interpreter was used.  Dysuria      Prior to Admission medications  Medication Sig Start Date End Date Taking? Authorizing Provider  doxycycline  (VIBRAMYCIN ) 100 MG capsule Take 1 capsule (100 mg total) by mouth 2 (two) times daily. 06/20/24  Yes Cyndra Feinberg K, PA-C  valACYclovir  (VALTREX ) 1000 MG tablet Take 1 tablet (1,000 mg total) by mouth 2 (two) times daily for 10 days. 06/20/24 06/30/24 Yes Rainen Vanrossum K, PA-C  amoxicillin -clavulanate (AUGMENTIN ) 875-125 MG tablet Take 1 tablet by mouth every 12 (twelve) hours. 04/17/23   Ball, [place]  G, FNP    Allergies: Patient has no known allergies.    Review of Systems  HENT:  Positive for facial swelling.   Genitourinary:  Positive for dysuria.  All other systems reviewed and are negative.   Updated Vital Signs BP (!) 131/103   Pulse 88   Temp 98.9 F (37.2 C) (Oral)   Resp 20   Wt 81.6 kg   SpO2 99%   BMI 28.19 kg/m   Physical Exam Vitals reviewed.  Constitutional:      Appearance: Normal appearance.  HENT:     Head: Normocephalic and atraumatic.     Right Ear: External ear normal.     Left Ear: External ear normal.     Nose: Congestion present.     Comments: Swollen herpetic  looking lesion right nostril, erythematous area, small amount of bleeding. Eyes:     Extraocular Movements: Extraocular movements intact.     Pupils: Pupils are equal, round, and reactive to light.  Cardiovascular:     Rate and Rhythm: Normal rate.  Pulmonary:     Effort: Pulmonary effort is normal.  Abdominal:     General: Abdomen is flat.  Genitourinary:    General: Normal vulva.     Vagina: No vaginal discharge.  Musculoskeletal:        General: Normal range of motion.     Cervical back: Normal range of motion.  Skin:    General: Skin is warm.  Neurological:     General: No focal deficit present.     Mental Status: She is alert.     (all labs ordered are listed, but only abnormal results are displayed) Labs Reviewed  URINALYSIS, ROUTINE W REFLEX MICROSCOPIC - Abnormal; Notable for the following components:      Result Value   Specific Gravity, Urine 1.043 (*)    Hgb urine dipstick SMALL (*)    Protein, ur TRACE (*)    Bacteria, UA RARE (*)    All other components within normal limits  WET PREP, GENITAL  HSV CULTURE AND TYPING  PREGNANCY, URINE  GC/CHLAMYDIA PROBE AMP ([place])  NOT AT [facility]    EKG: None  Radiology: No results found.   Procedures   Medications Ordered in the ED - No data to display                                  Medical Decision Making Patient complains of a swollen area in her right nostril.  Patient has had cold sores in the past.  Patient reports the area is now draining.  Amount and/or Complexity of Data Reviewed Labs: ordered. Decision-making details documented in ED Course.    Details: Labs ordered reviewed and interpreted UA shows 6-10 white blood cells 6-10 red blood cells Wet prep shows no acute findings GC chlamydia are pending Herpes culture from nose is pending  Risk Prescription drug management.   Patient is counseled on herpes simplex.  Patient is given a prescription for doxycycline  and valacyclovir .  Patient is  advised to schedule follow-up with her primary care physician the first of next week.  She is advised to return if symptoms worsen or change.     Final diagnoses:  External nasal lesion    ED Discharge Orders          Ordered    valACYclovir  (VALTREX ) 1000 MG tablet  2 times daily        06/20/24 2033    doxycycline  (VIBRAMYCIN ) 100 MG capsule  2 times daily        06/20/24 2033           An After Visit Summary was printed and given to the patient.     Flint Sonny POUR, PA-C 06/20/24 2038    Ruthe Cornet, DO 06/20/24 2216  "

## 2024-06-20 NOTE — Discharge Instructions (Addendum)
You have test pending 

## 2024-06-20 NOTE — ED Triage Notes (Signed)
 Pt arrives with c/o dysuria that started about a month ago. Pt c/o hematuria. Pt also reports wound at the base of her nose.

## 2024-06-20 NOTE — ED Notes (Signed)
 Pt states she is unable to provide a urine sample at this time.

## 2024-06-21 LAB — GC/CHLAMYDIA PROBE AMP (~~LOC~~) NOT AT ARMC
Chlamydia: NEGATIVE
Comment: NEGATIVE
Comment: NORMAL
Neisseria Gonorrhea: NEGATIVE

## 2024-06-23 LAB — HSV CULTURE AND TYPING
